# Patient Record
Sex: Female | Born: 1965 | Race: Black or African American | Hispanic: No | Marital: Single | Smoking: Former smoker
Health system: Southern US, Community
[De-identification: ages and names within clinical notes are randomized; demographics above are authoritative.]

## PROBLEM LIST (undated history)

## (undated) DIAGNOSIS — E119 Type 2 diabetes mellitus without complications: Secondary | ICD-10-CM

## (undated) DIAGNOSIS — N289 Disorder of kidney and ureter, unspecified: Secondary | ICD-10-CM

## (undated) DIAGNOSIS — M329 Systemic lupus erythematosus, unspecified: Secondary | ICD-10-CM

## (undated) DIAGNOSIS — G43909 Migraine, unspecified, not intractable, without status migrainosus: Secondary | ICD-10-CM

## (undated) DIAGNOSIS — J45909 Unspecified asthma, uncomplicated: Secondary | ICD-10-CM

## (undated) DIAGNOSIS — I10 Essential (primary) hypertension: Secondary | ICD-10-CM

## (undated) DIAGNOSIS — IMO0002 Reserved for concepts with insufficient information to code with codable children: Secondary | ICD-10-CM

## (undated) DIAGNOSIS — I7300 Raynaud's syndrome without gangrene: Secondary | ICD-10-CM

## (undated) DIAGNOSIS — G5600 Carpal tunnel syndrome, unspecified upper limb: Secondary | ICD-10-CM

## (undated) HISTORY — PX: OTHER SURGICAL HISTORY: SHX169

## (undated) HISTORY — PX: TUBAL LIGATION: SHX77

## (undated) HISTORY — PX: CHOLECYSTECTOMY: SHX55

---

## 2008-09-10 ENCOUNTER — Emergency Department (HOSPITAL_BASED_OUTPATIENT_CLINIC_OR_DEPARTMENT_OTHER): Admission: EM | Admit: 2008-09-10 | Discharge: 2008-09-10 | Payer: Self-pay | Admitting: Emergency Medicine

## 2010-05-07 LAB — COMPREHENSIVE METABOLIC PANEL
AST: 76 U/L — ABNORMAL HIGH (ref 0–37)
Albumin: 4.3 g/dL (ref 3.5–5.2)
Calcium: 9.3 mg/dL (ref 8.4–10.5)
Chloride: 103 mEq/L (ref 96–112)
Creatinine, Ser: 1.1 mg/dL (ref 0.4–1.2)
GFR calc Af Amer: >60 mL/min (ref >60)
Total Bilirubin: 0.6 mg/dL (ref 0.3–1.2)

## 2010-05-07 LAB — DIFFERENTIAL
Eosinophils Relative: 0 % (ref 0–5)
Lymphocytes Relative: 18 % (ref 12–46)
Lymphs Abs: 2.2 10*3/uL (ref 0.7–4.0)
Monocytes Absolute: 0.7 10*3/uL (ref 0.1–1.0)

## 2010-05-07 LAB — URINE MICROSCOPIC-ADD ON

## 2010-05-07 LAB — URINALYSIS, ROUTINE W REFLEX MICROSCOPIC
Glucose, UA: NEGATIVE mg/dL
Specific Gravity, Urine: 1.021 (ref 1.005–1.030)
pH: 6 (ref 5.0–8.0)

## 2010-05-07 LAB — CBC
MCV: 87.1 fL (ref 78.0–100.0)
Platelets: 273 10*3/uL (ref 150–400)
WBC: 11.9 10*3/uL — ABNORMAL HIGH (ref 4.0–10.5)

## 2010-06-07 ENCOUNTER — Emergency Department (HOSPITAL_BASED_OUTPATIENT_CLINIC_OR_DEPARTMENT_OTHER)
Admission: EM | Admit: 2010-06-07 | Discharge: 2010-06-08 | Disposition: A | Discharge status: Home / Self Care | Payer: Self-pay | Attending: Emergency Medicine | Admitting: Emergency Medicine

## 2010-06-07 DIAGNOSIS — J45909 Unspecified asthma, uncomplicated: Secondary | ICD-10-CM | POA: Insufficient documentation

## 2010-06-07 DIAGNOSIS — G43909 Migraine, unspecified, not intractable, without status migrainosus: Secondary | ICD-10-CM | POA: Insufficient documentation

## 2011-10-02 ENCOUNTER — Emergency Department (HOSPITAL_BASED_OUTPATIENT_CLINIC_OR_DEPARTMENT_OTHER)
Admission: EM | Admit: 2011-10-02 | Discharge: 2011-10-02 | Disposition: A | Discharge status: Home / Self Care | Payer: Self-pay | Attending: Emergency Medicine | Admitting: Emergency Medicine

## 2011-10-02 ENCOUNTER — Encounter (HOSPITAL_BASED_OUTPATIENT_CLINIC_OR_DEPARTMENT_OTHER): Payer: Self-pay | Admitting: *Deleted

## 2011-10-02 DIAGNOSIS — J45909 Unspecified asthma, uncomplicated: Secondary | ICD-10-CM | POA: Insufficient documentation

## 2011-10-02 DIAGNOSIS — Z9089 Acquired absence of other organs: Secondary | ICD-10-CM | POA: Insufficient documentation

## 2011-10-02 DIAGNOSIS — I10 Essential (primary) hypertension: Secondary | ICD-10-CM | POA: Insufficient documentation

## 2011-10-02 DIAGNOSIS — G43909 Migraine, unspecified, not intractable, without status migrainosus: Secondary | ICD-10-CM | POA: Insufficient documentation

## 2011-10-02 HISTORY — DX: Essential (primary) hypertension: I10

## 2011-10-02 HISTORY — DX: Migraine, unspecified, not intractable, without status migrainosus: G43.909

## 2011-10-02 HISTORY — DX: Unspecified asthma, uncomplicated: J45.909

## 2011-10-02 HISTORY — DX: Disorder of kidney and ureter, unspecified: N28.9

## 2011-10-02 LAB — BASIC METABOLIC PANEL
Calcium: 8.9 mg/dL (ref 8.4–10.5)
GFR calc non Af Amer: 74 mL/min — ABNORMAL LOW (ref >90)
Glucose, Bld: 106 mg/dL — ABNORMAL HIGH (ref 70–99)
Sodium: 140 mEq/L (ref 135–145)

## 2011-10-02 MED ORDER — HYDROMORPHONE HCL PF 1 MG/ML IJ SOLN
1.0000 mg | Freq: Once | INTRAMUSCULAR | Status: AC
  Administered 2011-10-02: 1 mg via INTRAVENOUS
  Filled 2011-10-02: qty 1

## 2011-10-02 MED ORDER — DIPHENHYDRAMINE HCL 50 MG/ML IJ SOLN
12.5000 mg | Freq: Once | INTRAMUSCULAR | Status: AC
  Administered 2011-10-02: 12.5 mg via INTRAVENOUS
  Filled 2011-10-02: qty 1

## 2011-10-02 MED ORDER — PROCHLORPERAZINE EDISYLATE 5 MG/ML IJ SOLN
10.0000 mg | Freq: Four times a day (QID) | INTRAMUSCULAR | Status: DC | PRN
Start: 2011-10-02 — End: 2011-10-02
  Filled 2011-10-02: qty 2

## 2011-10-02 MED ORDER — PROMETHAZINE HCL 25 MG PO TABS: 25.0000 mg | ORAL_TABLET | Freq: Four times a day (QID) | ORAL | Status: DC | PRN

## 2011-10-02 MED ORDER — SODIUM CHLORIDE 0.9 % IV SOLN
Freq: Once | INTRAVENOUS | Status: AC
  Administered 2011-10-02: 19:00:00 via INTRAVENOUS

## 2011-10-02 MED ORDER — PROCHLORPERAZINE MALEATE 10 MG PO TABS
10.0000 mg | ORAL_TABLET | Freq: Four times a day (QID) | ORAL | Status: DC | PRN
  Administered 2011-10-02: 10 mg via ORAL

## 2011-10-02 MED ORDER — KETOROLAC TROMETHAMINE 30 MG/ML IJ SOLN
15.0000 mg | Freq: Once | INTRAMUSCULAR | Status: AC
  Administered 2011-10-02: 15 mg via INTRAVENOUS
  Filled 2011-10-02: qty 1

## 2011-10-02 MED ORDER — HYDROCODONE-ACETAMINOPHEN 5-325 MG PO TABS: 1.0000 | ORAL_TABLET | ORAL | Status: AC | PRN

## 2011-10-02 MED ORDER — ALUM & MAG HYDROXIDE-SIMETH 200-200-20 MG/5ML PO SUSP
15.0000 mL | Freq: Once | ORAL | Status: AC
  Administered 2011-10-02: 15 mL via ORAL
  Filled 2011-10-02: qty 30

## 2011-10-02 MED ORDER — PROCHLORPERAZINE MALEATE 10 MG PO TABS
ORAL_TABLET | ORAL | Status: AC
  Filled 2011-10-02: qty 1

## 2011-10-02 NOTE — ED Notes (Signed)
Pt. Reports not taking her B?P med for a couple of mths now.  Pt. Reports no specific reason why.

## 2011-10-02 NOTE — ED Notes (Signed)
BP noted. Pt admits she stopped taking her BP medication a few months ago.

## 2011-10-02 NOTE — ED Notes (Signed)
Headache x 4 days

## 2011-10-02 NOTE — ED Notes (Signed)
Pt now c/o gastritis pain from meds she has been giving. RN made aware.

## 2011-10-02 NOTE — ED Provider Notes (Signed)
History     CSN: [id number]  Arrival date & time 10/02/11  1742   First MD Initiated Contact with Patient 10/02/11 1754      Chief Complaint  Patient presents with  . Migraine    (Consider location/radiation/quality/duration/timing/severity/associated sxs/prior treatment) HPIAnita Sharlet Bailey is a 46 y.o. female with a history of high blood pressure and migraine headaches. Patient has not been taking her hypertensive medication due to cost. On intake her blood pressures actually not that high with being 173/74. She's complaining about recurrent headaches today being a migraine headache that is typical for her migraine headaches, it came on gradually, is throbbing, located on the left side of her head, associated with photophobia, mild phonophobia, nausea, worse on movement.  No other alleviating or exacerbating factors and no other associated symptoms.  Past Medical History  Diagnosis Date  . Hypertension   . Migraine   . Renal disorder   . Asthma     Past Surgical History  Procedure Date  . Cholecystectomy   . C sections     No family history on file.  History  Substance Use Topics  . Smoking status: Former Games developer  . Smokeless tobacco: Not on file  . Alcohol Use: No    OB History    Grav Para Term Preterm Abortions TAB SAB Ect Mult Living                  Review of SystemsPositive for headache and nausea; Patient denies any fevers or chills, changes in vision, earache, sore throat, neck pain or stiffness, chest pain or pressure, palpitations, syncope, dyspnea, cough, wheezing,  abdominal pain, vomiting, diarrhea, melena, red bloody stools, frequency, dysuria, myalgias, arthralgias, back pain, recent trauma, rash, itching, skin lesions, easy bruising or bleeding, seizures, numbness, tingling or weakness.     Allergies  Iodine  Home Medications   Current Outpatient Rx  Name Route Sig Dispense Refill  . ACETAMINOPHEN 500 MG PO TABS Oral Take 1,000 mg by mouth every  6 (six) hours as needed. For migraine    . IBUPROFEN 800 MG PO TABS Oral Take 800 mg by mouth every 8 (eight) hours as needed. For migraine      BP 124/75  Pulse 85  Temp 98.4 F (36.9 C) (Oral)  Resp 20  SpO2 100%  Physical Exam VITAL SIGNS:   Filed Vitals:   10/02/11 2256  BP: 152/91  Pulse: 56  Temp:   Resp: 18   CONSTITUTIONAL: Awake, oriented, appears non-toxic HENT: Atraumatic, normocephalic, oral mucosa pink and moist, airway patent. Nares patent without drainage. External ears normal. EYES: Conjunctiva clear, EOMI, PERRLA NECK: Trachea midline, non-tender, supple CARDIOVASCULAR: Normal heart rate, Normal rhythm, No murmurs, rubs, gallops PULMONARY/CHEST: Clear to auscultation, no rhonchi, wheezes, or rales. Symmetrical breath sounds. Non-tender. ABDOMINAL: Non-distended, soft, non-tender - no rebound or guarding.  BS normal. NEUROLOGIC: Non-focal, moving all four extremities, no gross sensory or motor deficits.  EXTREMITIES: No clubbing, cyanosis, or edema SKIN: Warm, Dry, No erythema, No rash  ED Course  Procedures (including critical care time)  Labs Reviewed  BASIC METABOLIC PANEL - Abnormal; Notable for the following:    Potassium 3.3 (*)     Glucose, Bld 106 (*)     GFR calc non Af Amer 74 (*)     GFR calc Af Amer 86 (*)     All other components within normal limits   No results found.   1. Migraine  MDM  Emma Bailey is a 46 y.o. female presenting with a migraine headache typical of her migraine headaches in the past. Patient has been out of her hypertensive medications recently, due to this we'll check a BMP to check her creatinine she does have renal issues.  I do not think this headache is related to her blood pressure.  She's having no chest pain and no shortness of breath suggestive of ACS or pulmonary edema respectively.   Treat patient for migraine headache with fluids, Toradol and Compazine, and Benadryl. Patient did give good relief  with the first set of medications, added a one-time dose of Dilaudid which made her feel much better. She also requested some Maalox for some stomach upset and she would like to go home. Patient's symptoms are completely resolved and consistent with prior migraine headaches, she has no endorgan damage suggestive of hypertensive emergency, her presentation is not suggestive of subarachnoid hemorrhage or central nervous system infection. Patient discharged home in good condition.  She is urged to followup with her primary care physician or nephrologist to further manage her blood pressure. Patient is able to eat, will not supplement potassium at this time, she can eat a meal.         Jones Skene, MD 10/03/11 0149

## 2012-05-31 ENCOUNTER — Emergency Department (HOSPITAL_BASED_OUTPATIENT_CLINIC_OR_DEPARTMENT_OTHER)
Admission: EM | Admit: 2012-05-31 | Discharge: 2012-05-31 | Disposition: A | Discharge status: Home / Self Care | Payer: Self-pay | Attending: Emergency Medicine | Admitting: Emergency Medicine

## 2012-05-31 ENCOUNTER — Encounter (HOSPITAL_BASED_OUTPATIENT_CLINIC_OR_DEPARTMENT_OTHER): Payer: Self-pay | Admitting: *Deleted

## 2012-05-31 DIAGNOSIS — IMO0002 Reserved for concepts with insufficient information to code with codable children: Secondary | ICD-10-CM

## 2012-05-31 DIAGNOSIS — Y9389 Activity, other specified: Secondary | ICD-10-CM | POA: Insufficient documentation

## 2012-05-31 DIAGNOSIS — Z79899 Other long term (current) drug therapy: Secondary | ICD-10-CM | POA: Insufficient documentation

## 2012-05-31 DIAGNOSIS — Z87891 Personal history of nicotine dependence: Secondary | ICD-10-CM | POA: Insufficient documentation

## 2012-05-31 DIAGNOSIS — Y9289 Other specified places as the place of occurrence of the external cause: Secondary | ICD-10-CM | POA: Insufficient documentation

## 2012-05-31 DIAGNOSIS — E119 Type 2 diabetes mellitus without complications: Secondary | ICD-10-CM | POA: Insufficient documentation

## 2012-05-31 DIAGNOSIS — S61409A Unspecified open wound of unspecified hand, initial encounter: Secondary | ICD-10-CM | POA: Insufficient documentation

## 2012-05-31 DIAGNOSIS — W268XXA Contact with other sharp object(s), not elsewhere classified, initial encounter: Secondary | ICD-10-CM | POA: Insufficient documentation

## 2012-05-31 DIAGNOSIS — G43909 Migraine, unspecified, not intractable, without status migrainosus: Secondary | ICD-10-CM | POA: Insufficient documentation

## 2012-05-31 DIAGNOSIS — Z23 Encounter for immunization: Secondary | ICD-10-CM | POA: Insufficient documentation

## 2012-05-31 DIAGNOSIS — I10 Essential (primary) hypertension: Secondary | ICD-10-CM | POA: Insufficient documentation

## 2012-05-31 DIAGNOSIS — J45909 Unspecified asthma, uncomplicated: Secondary | ICD-10-CM | POA: Insufficient documentation

## 2012-05-31 DIAGNOSIS — Z87448 Personal history of other diseases of urinary system: Secondary | ICD-10-CM | POA: Insufficient documentation

## 2012-05-31 HISTORY — DX: Type 2 diabetes mellitus without complications: E11.9

## 2012-05-31 MED ORDER — TETANUS-DIPHTH-ACELL PERTUSSIS 5-2.5-18.5 LF-MCG/0.5 IM SUSP
0.5000 mL | Freq: Once | INTRAMUSCULAR | Status: AC
  Administered 2012-05-31: 0.5 mL via INTRAMUSCULAR
  Filled 2012-05-31: qty 0.5

## 2012-05-31 NOTE — ED Notes (Signed)
Lac to left hand. Cut with razor while trying to open toy.Bleeding controlled.

## 2012-05-31 NOTE — ED Provider Notes (Signed)
History     CSN: [id number]  Arrival date & time 05/31/12  4098   First MD Initiated Contact with Patient 05/31/12 1938      Chief Complaint  Patient presents with  . Laceration    (Consider location/radiation/quality/duration/timing/severity/associated sxs/prior treatment) HPI Comments: Patient presents emergency department with chief complaint of left hand laceration. She states that she was trying to (4 with a box cutter, when she cuts the posterior aspect of her left hand. She states that it is moderately painful. Her last tetanus shot is unknown. Bleeding is controlled at this time. She denies any radiating symptoms. She has not taken anything to alleviate her symptoms.  The history is provided by the patient. No language interpreter was used.    Past Medical History  Diagnosis Date  . Hypertension   . Migraine   . Renal disorder   . Asthma   . Diabetes mellitus without complication     Past Surgical History  Procedure Laterality Date  . Cholecystectomy    . C sections      History reviewed. No pertinent family history.  History  Substance Use Topics  . Smoking status: Former Games developer  . Smokeless tobacco: Not on file  . Alcohol Use: No    OB History   Grav Para Term Preterm Abortions TAB SAB Ect Mult Living                  Review of Systems  All other systems reviewed and are negative.    Allergies  Iodine  Home Medications   Current Outpatient Rx  Name  Route  Sig  Dispense  Refill  . candesartan (ATACAND) 16 MG tablet   Oral   Take 16 mg by mouth daily.         . cholecalciferol (VITAMIN D) 1000 UNITS tablet   Oral   Take 1,000 Units by mouth daily.         Marland Kitchen spironolactone-hydrochlorothiazide (ALDACTAZIDE) 25-25 MG per tablet   Oral   Take 0.5 tablets by mouth daily.         Marland Kitchen topiramate (TOPAMAX) 25 MG capsule   Oral   Take 25 mg by mouth 2 (two) times daily.         Marland Kitchen acetaminophen (TYLENOL) 500 MG tablet   Oral   Take  1,000 mg by mouth every 6 (six) hours as needed. For migraine         . ibuprofen (ADVIL,MOTRIN) 800 MG tablet   Oral   Take 800 mg by mouth every 8 (eight) hours as needed. For migraine         . EXPIRED: promethazine (PHENERGAN) 25 MG tablet   Oral   Take 1 tablet (25 mg total) by mouth every 6 (six) hours as needed for nausea.   30 tablet   0     BP 122/60  Pulse 50  Temp(Src) 98.4 F (36.9 C) (Oral)  Resp 20  Ht 5\' 7"  (1.702 m)  Wt 195 lb (88.451 kg)  BMI 30.53 kg/m2  SpO2 98%  LMP 05/24/2012  Physical Exam  Nursing note and vitals reviewed. Constitutional: She is oriented to person, place, and time. She appears well-developed and well-nourished.  HENT:  Head: Normocephalic and atraumatic.  Eyes: Conjunctivae and EOM are normal.  Neck: Normal range of motion.  Cardiovascular: Normal rate.   Pulmonary/Chest: Effort normal.  Abdominal: She exhibits no distension.  Musculoskeletal: Normal range of motion.  Neurological: She is alert and  oriented to person, place, and time.  Skin: Skin is dry.     1 cm laceration to posterior aspect of left hand as diagrammed  Psychiatric: She has a normal mood and affect. Her behavior is normal. Judgment and thought content normal.    ED Course  Procedures (including critical care time)  Labs Reviewed - No data to display No results found. LACERATION REPAIR Performed by: Roxy Horseman Authorized by: Roxy Horseman Consent: Verbal consent obtained. Risks and benefits: risks, benefits and alternatives were discussed Consent given by: patient Patient identity confirmed: provided demographic data Prepped and Draped in normal sterile fashion Wound explored  Laceration Location: Posterior aspect of left hand  Laceration Length: 1cm  No Foreign Bodies seen or palpated  Anesthesia: local infiltration  Local anesthetic: lidocaine 2 % without epinephrine  Anesthetic total: 2 ml  Irrigation method: syringe Amount  of cleaning: standard  Skin closure: 4-0 Prolene   Number of sutures: 2   Technique: Simple   Patient tolerance: Patient tolerated the procedure well with no immediate complications.   1. Laceration       MDM  Patient with uncomplicated skin laceration of posterior left hand. Tetanus shot updated. Laceration repaired without complication. Patient is stable and ready for discharge.        Roxy Horseman, PA-C 05/31/12 2032

## 2012-05-31 NOTE — ED Provider Notes (Signed)
Medical screening examination/treatment/procedure(s) were performed by non-physician practitioner and as supervising physician I was immediately available for consultation/collaboration.   Lanika Colgate, MD 05/31/12 2345 

## 2012-06-11 ENCOUNTER — Emergency Department (HOSPITAL_BASED_OUTPATIENT_CLINIC_OR_DEPARTMENT_OTHER)
Admission: EM | Admit: 2012-06-11 | Discharge: 2012-06-11 | Disposition: A | Discharge status: Home / Self Care | Payer: Self-pay | Attending: Emergency Medicine | Admitting: Emergency Medicine

## 2012-06-11 ENCOUNTER — Encounter (HOSPITAL_BASED_OUTPATIENT_CLINIC_OR_DEPARTMENT_OTHER): Payer: Self-pay | Admitting: Student

## 2012-06-11 DIAGNOSIS — Z79899 Other long term (current) drug therapy: Secondary | ICD-10-CM | POA: Insufficient documentation

## 2012-06-11 DIAGNOSIS — Z4802 Encounter for removal of sutures: Secondary | ICD-10-CM | POA: Insufficient documentation

## 2012-06-11 DIAGNOSIS — G43909 Migraine, unspecified, not intractable, without status migrainosus: Secondary | ICD-10-CM | POA: Insufficient documentation

## 2012-06-11 DIAGNOSIS — I10 Essential (primary) hypertension: Secondary | ICD-10-CM | POA: Insufficient documentation

## 2012-06-11 DIAGNOSIS — E119 Type 2 diabetes mellitus without complications: Secondary | ICD-10-CM | POA: Insufficient documentation

## 2012-06-11 DIAGNOSIS — Z87891 Personal history of nicotine dependence: Secondary | ICD-10-CM | POA: Insufficient documentation

## 2012-06-11 DIAGNOSIS — J45909 Unspecified asthma, uncomplicated: Secondary | ICD-10-CM | POA: Insufficient documentation

## 2012-06-11 DIAGNOSIS — Z87448 Personal history of other diseases of urinary system: Secondary | ICD-10-CM | POA: Insufficient documentation

## 2012-06-11 NOTE — ED Provider Notes (Signed)
History     CSN: [id number]  Arrival date & time 06/11/12  7829   First MD Initiated Contact with Patient 06/11/12 [phone]      Chief Complaint  Patient presents with  . Suture / Staple Removal    (Consider location/radiation/quality/duration/timing/severity/associated sxs/prior treatment) Patient is a 47 y.o. female presenting with suture removal. The history is provided by the patient.  Suture / Staple Removal  The sutures were placed 11 to 14 days ago. There has been no treatment since the wound repair. There is no redness present. There is no swelling present. The pain has improved. She has no difficulty moving the affected extremity or digit.    Past Medical History  Diagnosis Date  . Hypertension   . Migraine   . Renal disorder   . Asthma   . Diabetes mellitus without complication     Past Surgical History  Procedure Laterality Date  . Cholecystectomy    . C sections      History reviewed. No pertinent family history.  History  Substance Use Topics  . Smoking status: Former Games developer  . Smokeless tobacco: Not on file  . Alcohol Use: No    OB History   Grav Para Term Preterm Abortions TAB SAB Ect Mult Living                  Review of Systems  All other systems reviewed and are negative.    Allergies  Iodine  Home Medications   Current Outpatient Rx  Name  Route  Sig  Dispense  Refill  . acetaminophen (TYLENOL) 500 MG tablet   Oral   Take 1,000 mg by mouth every 6 (six) hours as needed. For migraine         . candesartan (ATACAND) 16 MG tablet   Oral   Take 16 mg by mouth daily.         . cholecalciferol (VITAMIN D) 1000 UNITS tablet   Oral   Take 1,000 Units by mouth daily.         Marland Kitchen ibuprofen (ADVIL,MOTRIN) 800 MG tablet   Oral   Take 800 mg by mouth every 8 (eight) hours as needed. For migraine         . EXPIRED: promethazine (PHENERGAN) 25 MG tablet   Oral   Take 1 tablet (25 mg total) by mouth every 6 (six) hours as needed  for nausea.   30 tablet   0   . spironolactone-hydrochlorothiazide (ALDACTAZIDE) 25-25 MG per tablet   Oral   Take 0.5 tablets by mouth every other day.          . topiramate (TOPAMAX) 25 MG capsule   Oral   Take 25 mg by mouth 2 (two) times daily.           LMP 05/24/2012  Physical Exam  Nursing note reviewed. Constitutional: She appears well-developed and well-nourished.  Musculoskeletal: Normal range of motion.  Well healing wound left hand.   Neurological: She is alert.  Skin: Skin is warm and dry.  Psychiatric: She has a normal mood and affect.    ED Course  Procedures (including critical care time)  Labs Reviewed - No data to display No results found.   No diagnosis found.    MDM  Sutures removed by tech.  Patient advised re s/s of infection and need for recheck if these occure.      Hilario Quarry, MD 06/11/12 [phone]

## 2012-06-11 NOTE — ED Notes (Signed)
Suture removal on left hand 

## 2012-08-04 ENCOUNTER — Encounter (HOSPITAL_BASED_OUTPATIENT_CLINIC_OR_DEPARTMENT_OTHER): Payer: Self-pay | Admitting: Emergency Medicine

## 2012-08-04 ENCOUNTER — Emergency Department (HOSPITAL_BASED_OUTPATIENT_CLINIC_OR_DEPARTMENT_OTHER)
Admission: EM | Admit: 2012-08-04 | Discharge: 2012-08-04 | Disposition: A | Discharge status: Home / Self Care | Payer: Medicaid Other | Attending: Emergency Medicine | Admitting: Emergency Medicine

## 2012-08-04 DIAGNOSIS — E119 Type 2 diabetes mellitus without complications: Secondary | ICD-10-CM | POA: Insufficient documentation

## 2012-08-04 DIAGNOSIS — I10 Essential (primary) hypertension: Secondary | ICD-10-CM | POA: Insufficient documentation

## 2012-08-04 DIAGNOSIS — K2990 Gastroduodenitis, unspecified, without bleeding: Secondary | ICD-10-CM | POA: Insufficient documentation

## 2012-08-04 DIAGNOSIS — K2970 Gastritis, unspecified, without bleeding: Secondary | ICD-10-CM | POA: Insufficient documentation

## 2012-08-04 DIAGNOSIS — Z87448 Personal history of other diseases of urinary system: Secondary | ICD-10-CM | POA: Insufficient documentation

## 2012-08-04 DIAGNOSIS — Z9889 Other specified postprocedural states: Secondary | ICD-10-CM | POA: Insufficient documentation

## 2012-08-04 DIAGNOSIS — Z87891 Personal history of nicotine dependence: Secondary | ICD-10-CM | POA: Insufficient documentation

## 2012-08-04 DIAGNOSIS — J45909 Unspecified asthma, uncomplicated: Secondary | ICD-10-CM | POA: Insufficient documentation

## 2012-08-04 DIAGNOSIS — Z79899 Other long term (current) drug therapy: Secondary | ICD-10-CM | POA: Insufficient documentation

## 2012-08-04 DIAGNOSIS — Z3202 Encounter for pregnancy test, result negative: Secondary | ICD-10-CM | POA: Insufficient documentation

## 2012-08-04 DIAGNOSIS — Z9851 Tubal ligation status: Secondary | ICD-10-CM | POA: Insufficient documentation

## 2012-08-04 DIAGNOSIS — G43909 Migraine, unspecified, not intractable, without status migrainosus: Secondary | ICD-10-CM | POA: Insufficient documentation

## 2012-08-04 DIAGNOSIS — Z9089 Acquired absence of other organs: Secondary | ICD-10-CM | POA: Insufficient documentation

## 2012-08-04 LAB — COMPREHENSIVE METABOLIC PANEL
Alkaline Phosphatase: 85 U/L (ref 39–117)
BUN: 16 mg/dL (ref 6–23)
Chloride: 103 mEq/L (ref 96–112)
GFR calc Af Amer: 60 mL/min — ABNORMAL LOW (ref >90)
GFR calc non Af Amer: 52 mL/min — ABNORMAL LOW (ref >90)
Glucose, Bld: 116 mg/dL — ABNORMAL HIGH (ref 70–99)
Potassium: 3.3 mEq/L — ABNORMAL LOW (ref 3.5–5.1)
Total Bilirubin: 0.5 mg/dL (ref 0.3–1.2)

## 2012-08-04 LAB — URINALYSIS, ROUTINE W REFLEX MICROSCOPIC
Hgb urine dipstick: NEGATIVE
Ketones, ur: NEGATIVE mg/dL
Leukocytes, UA: NEGATIVE
Protein, ur: NEGATIVE mg/dL
Urobilinogen, UA: 1 mg/dL (ref 0.0–1.0)

## 2012-08-04 LAB — CBC WITH DIFFERENTIAL/PLATELET
HCT: 36.8 % (ref 36.0–46.0)
Hemoglobin: 12.9 g/dL (ref 12.0–15.0)
Lymphs Abs: 3.1 10*3/uL (ref 0.7–4.0)
MCH: 29.3 pg (ref 26.0–34.0)
Monocytes Absolute: 0.6 10*3/uL (ref 0.1–1.0)
Monocytes Relative: 9 % (ref 3–12)
Neutro Abs: 3.4 10*3/uL (ref 1.7–7.7)
Neutrophils Relative %: 45 % (ref 43–77)
RBC: 4.4 MIL/uL (ref 3.87–5.11)

## 2012-08-04 LAB — LIPASE, BLOOD: Lipase: 55 U/L (ref 11–59)

## 2012-08-04 MED ORDER — HYDROMORPHONE HCL PF 1 MG/ML IJ SOLN
1.0000 mg | Freq: Once | INTRAMUSCULAR | Status: AC
  Administered 2012-08-04: 1 mg via INTRAVENOUS
  Filled 2012-08-04: qty 1

## 2012-08-04 MED ORDER — ONDANSETRON HCL 4 MG/2ML IJ SOLN
4.0000 mg | Freq: Once | INTRAMUSCULAR | Status: AC
  Administered 2012-08-04: 4 mg via INTRAVENOUS
  Filled 2012-08-04: qty 2

## 2012-08-04 MED ORDER — SODIUM CHLORIDE 0.9 % IV SOLN
INTRAVENOUS | Status: DC
  Administered 2012-08-04: 06:00:00 via INTRAVENOUS

## 2012-08-04 MED ORDER — PANTOPRAZOLE SODIUM 40 MG IV SOLR
40.0000 mg | Freq: Once | INTRAVENOUS | Status: AC
  Administered 2012-08-04: 40 mg via INTRAVENOUS
  Filled 2012-08-04: qty 40

## 2012-08-04 MED ORDER — OXYCODONE-ACETAMINOPHEN 5-325 MG PO TABS: 1.0000 | ORAL_TABLET | Freq: Four times a day (QID) | ORAL | Status: DC | PRN

## 2012-08-04 MED ORDER — SODIUM CHLORIDE 0.9 % IV BOLUS (SEPSIS)
1000.0000 mL | Freq: Once | INTRAVENOUS | Status: AC
  Administered 2012-08-04: 1000 mL via INTRAVENOUS

## 2012-08-04 MED ORDER — PANTOPRAZOLE SODIUM 40 MG PO TBEC: 40.0000 mg | DELAYED_RELEASE_TABLET | Freq: Every day | ORAL | Status: DC

## 2012-08-04 MED ORDER — PROMETHAZINE HCL 25 MG PO TABS: 25.0000 mg | ORAL_TABLET | Freq: Four times a day (QID) | ORAL | Status: DC | PRN

## 2012-08-04 NOTE — ED Notes (Signed)
Pt called for family member to transport her home family member currently enroute.

## 2012-08-04 NOTE — Discharge Instructions (Signed)

## 2012-08-04 NOTE — ED Provider Notes (Signed)
History    CSN: [id number] Arrival date & time 08/04/12  0418  First MD Initiated Contact with Patient 08/04/12 [phone]     Chief Complaint  Patient presents with  . Abdominal Pain   (Consider location/radiation/quality/duration/timing/severity/associated sxs/prior Treatment) Patient is a 47 y.o. female presenting with abdominal pain.  Abdominal Pain Associated symptoms include abdominal pain.   Pt with history of remote cholecystectomy and occasional gastritis reports she woke up with moderate epigastric pain a short time ago, not improved with alka-seltzer or hot tea. She states pain has become worse and more diffuse and now associated with multiple episodes of non-bloody emesis. No fever, no diarrhea or dysuria.   Past Medical History  Diagnosis Date  . Hypertension   . Migraine   . Renal disorder   . Asthma   . Diabetes mellitus without complication    Past Surgical History  Procedure Laterality Date  . Cholecystectomy    . C sections    . Tubal ligation     No family history on file. History  Substance Use Topics  . Smoking status: Former Games developer  . Smokeless tobacco: Not on file  . Alcohol Use: No   OB History   Grav Para Term Preterm Abortions TAB SAB Ect Mult Living                 Review of Systems  Gastrointestinal: Positive for abdominal pain.   All other systems reviewed and are negative except as noted in HPI.    Allergies  Iodine  Home Medications   Current Outpatient Rx  Name  Route  Sig  Dispense  Refill  . acetaminophen (TYLENOL) 500 MG tablet   Oral   Take 1,000 mg by mouth every 6 (six) hours as needed. For migraine         . candesartan (ATACAND) 16 MG tablet   Oral   Take 16 mg by mouth daily.         . cholecalciferol (VITAMIN D) 1000 UNITS tablet   Oral   Take 1,000 Units by mouth daily.         Marland Kitchen ibuprofen (ADVIL,MOTRIN) 800 MG tablet   Oral   Take 800 mg by mouth every 8 (eight) hours as needed. For migraine          . EXPIRED: promethazine (PHENERGAN) 25 MG tablet   Oral   Take 1 tablet (25 mg total) by mouth every 6 (six) hours as needed for nausea.   30 tablet   0   . spironolactone-hydrochlorothiazide (ALDACTAZIDE) 25-25 MG per tablet   Oral   Take 0.5 tablets by mouth every other day.          . topiramate (TOPAMAX) 25 MG capsule   Oral   Take 25 mg by mouth 2 (two) times daily.          BP 137/80  Pulse 68  Temp(Src) 98.4 F (36.9 C) (Oral)  Ht 5\' 7"  (1.702 m)  Wt 188 lb (85.276 kg)  BMI 29.44 kg/m2  SpO2 100% Physical Exam  Nursing note and vitals reviewed. Constitutional: She is oriented to person, place, and time. She appears well-developed and well-nourished.  HENT:  Head: Normocephalic and atraumatic.  Eyes: EOM are normal. Pupils are equal, round, and reactive to light.  Neck: Normal range of motion. Neck supple.  Cardiovascular: Normal rate, normal heart sounds and intact distal pulses.   Pulmonary/Chest: Effort normal and breath sounds normal.  Abdominal: Bowel sounds are  normal. She exhibits no distension. There is tenderness (moderate epigastric). There is no rebound and no guarding.  Musculoskeletal: Normal range of motion. She exhibits no edema and no tenderness.  Neurological: She is alert and oriented to person, place, and time. She has normal strength. No cranial nerve deficit or sensory deficit.  Skin: Skin is warm and dry. No rash noted.  Psychiatric: She has a normal mood and affect.    ED Course  Procedures (including critical care time) Labs Reviewed  COMPREHENSIVE METABOLIC PANEL - Abnormal; Notable for the following:    Potassium 3.3 (*)    Glucose, Bld 116 (*)    Creatinine, Ser 1.20 (*)    AST 73 (*)    ALT 37 (*)    GFR calc non Af Amer 52 (*)    GFR calc Af Amer 60 (*)    All other components within normal limits  URINALYSIS, ROUTINE W REFLEX MICROSCOPIC  CBC WITH DIFFERENTIAL  LIPASE, BLOOD  PREGNANCY, URINE   No results found. 1.  Gastritis     MDM  Check labs, give IVF, pain and nausea meds.   Labs unremarkable, about at baseline from previous visits. Pain improved with IVF and meds. Will give a PO trial and anticipate discharge with oral meds at home.   Kayce Betty B. Bernette Mayers, MD 08/04/12 [phone]

## 2012-08-04 NOTE — ED Notes (Signed)
Two episodes of emesis since arrival currently denies pain or nausea no further emesis noted at this time.

## 2012-08-04 NOTE — ED Notes (Addendum)
Pt reports sudden onset abd pain after eating late meal denies vomiting but admits to nausea

## 2012-08-05 ENCOUNTER — Emergency Department (HOSPITAL_BASED_OUTPATIENT_CLINIC_OR_DEPARTMENT_OTHER): Payer: Medicaid Other

## 2012-08-05 ENCOUNTER — Emergency Department (HOSPITAL_BASED_OUTPATIENT_CLINIC_OR_DEPARTMENT_OTHER)
Admission: EM | Admit: 2012-08-05 | Discharge: 2012-08-05 | Disposition: A | Discharge status: Other Acute Inpatient Hospital | Payer: Medicaid Other | Attending: Emergency Medicine | Admitting: Emergency Medicine

## 2012-08-05 ENCOUNTER — Encounter (HOSPITAL_BASED_OUTPATIENT_CLINIC_OR_DEPARTMENT_OTHER): Payer: Self-pay | Admitting: *Deleted

## 2012-08-05 DIAGNOSIS — Z9851 Tubal ligation status: Secondary | ICD-10-CM | POA: Insufficient documentation

## 2012-08-05 DIAGNOSIS — E119 Type 2 diabetes mellitus without complications: Secondary | ICD-10-CM | POA: Diagnosis not present

## 2012-08-05 DIAGNOSIS — G43909 Migraine, unspecified, not intractable, without status migrainosus: Secondary | ICD-10-CM | POA: Insufficient documentation

## 2012-08-05 DIAGNOSIS — I10 Essential (primary) hypertension: Secondary | ICD-10-CM | POA: Insufficient documentation

## 2012-08-05 DIAGNOSIS — R109 Unspecified abdominal pain: Secondary | ICD-10-CM | POA: Diagnosis present

## 2012-08-05 DIAGNOSIS — J45909 Unspecified asthma, uncomplicated: Secondary | ICD-10-CM | POA: Insufficient documentation

## 2012-08-05 DIAGNOSIS — Z87448 Personal history of other diseases of urinary system: Secondary | ICD-10-CM | POA: Diagnosis not present

## 2012-08-05 DIAGNOSIS — M549 Dorsalgia, unspecified: Secondary | ICD-10-CM | POA: Diagnosis not present

## 2012-08-05 DIAGNOSIS — Z87891 Personal history of nicotine dependence: Secondary | ICD-10-CM | POA: Insufficient documentation

## 2012-08-05 DIAGNOSIS — Z79899 Other long term (current) drug therapy: Secondary | ICD-10-CM | POA: Diagnosis not present

## 2012-08-05 DIAGNOSIS — Z9089 Acquired absence of other organs: Secondary | ICD-10-CM | POA: Diagnosis not present

## 2012-08-05 DIAGNOSIS — R748 Abnormal levels of other serum enzymes: Secondary | ICD-10-CM | POA: Diagnosis not present

## 2012-08-05 DIAGNOSIS — K831 Obstruction of bile duct: Secondary | ICD-10-CM | POA: Diagnosis not present

## 2012-08-05 LAB — URINALYSIS, ROUTINE W REFLEX MICROSCOPIC
Glucose, UA: NEGATIVE mg/dL
Leukocytes, UA: NEGATIVE
Protein, ur: 30 mg/dL — AB
Specific Gravity, Urine: 1.012 (ref 1.005–1.030)
Urobilinogen, UA: 2 mg/dL — ABNORMAL HIGH (ref 0.0–1.0)

## 2012-08-05 LAB — COMPREHENSIVE METABOLIC PANEL
Albumin: 3.9 g/dL (ref 3.5–5.2)
BUN: 9 mg/dL (ref 6–23)
Calcium: 9.7 mg/dL (ref 8.4–10.5)
Creatinine, Ser: 1.2 mg/dL — ABNORMAL HIGH (ref 0.50–1.10)
Total Bilirubin: 5 mg/dL — ABNORMAL HIGH (ref 0.3–1.2)
Total Protein: 7.7 g/dL (ref 6.0–8.3)

## 2012-08-05 LAB — CBC
HCT: 35.2 % — ABNORMAL LOW (ref 36.0–46.0)
MCH: 29.6 pg (ref 26.0–34.0)
MCHC: 35.2 g/dL (ref 30.0–36.0)
MCV: 84 fL (ref 78.0–100.0)
RDW: 13.6 % (ref 11.5–15.5)

## 2012-08-05 LAB — URINE MICROSCOPIC-ADD ON

## 2012-08-05 LAB — LIPASE, BLOOD: Lipase: 34 U/L (ref 11–59)

## 2012-08-05 MED ORDER — HYDROMORPHONE HCL PF 1 MG/ML IJ SOLN
1.0000 mg | Freq: Once | INTRAMUSCULAR | Status: AC
  Administered 2012-08-05: 1 mg via INTRAVENOUS
  Filled 2012-08-05: qty 1

## 2012-08-05 MED ORDER — SODIUM CHLORIDE 0.9 % IV BOLUS (SEPSIS)
1000.0000 mL | Freq: Once | INTRAVENOUS | Status: AC
  Administered 2012-08-05: 1000 mL via INTRAVENOUS

## 2012-08-05 MED ORDER — ONDANSETRON HCL 4 MG/2ML IJ SOLN
4.0000 mg | Freq: Once | INTRAMUSCULAR | Status: AC
  Administered 2012-08-05: 4 mg via INTRAVENOUS
  Filled 2012-08-05: qty 2

## 2012-08-05 NOTE — ED Notes (Addendum)
Back pain. She was here for gastritis last week and pain in her back started afterward. Drove herself here.

## 2012-08-05 NOTE — ED Provider Notes (Signed)
History    CSN: [id number] Arrival date & time 08/05/12  1645  First MD Initiated Contact with Patient 08/05/12 1659     Chief Complaint  Patient presents with  . Back Pain   (Consider location/radiation/quality/duration/timing/severity/associated sxs/prior Treatment) HPI Pt presenting with c/o abdominal pain and left flank pain.  Pt states her pain began today- was discharged earlier this morning from ED for gastritis.  She states pain is constant- worse in her left upper abdomen and also in left flank.  No fever, no dysuria.  No changes in stool.  She tried taking oxycodone for her symptoms but this did not help.  She has not had much to eat or drink today.  She states she had cholecystectomy approx 30 years ago.  There are no other associated systemic symptoms, there are no other alleviating or modifying factors.  Past Medical History  Diagnosis Date  . Hypertension   . Migraine   . Renal disorder   . Asthma   . Diabetes mellitus without complication    Past Surgical History  Procedure Laterality Date  . Cholecystectomy    . C sections    . Tubal ligation     No family history on file. History  Substance Use Topics  . Smoking status: Former Games developer  . Smokeless tobacco: Not on file  . Alcohol Use: No   OB History   Grav Para Term Preterm Abortions TAB SAB Ect Mult Living                 Review of Systems ROS reviewed and all otherwise negative except for mentioned in HPI  Allergies  Iodine  Home Medications   Current Outpatient Rx  Name  Route  Sig  Dispense  Refill  . acetaminophen (TYLENOL) 500 MG tablet   Oral   Take 1,000 mg by mouth every 6 (six) hours as needed. For migraine         . candesartan (ATACAND) 16 MG tablet   Oral   Take 16 mg by mouth daily.         . cholecalciferol (VITAMIN D) 1000 UNITS tablet   Oral   Take 1,000 Units by mouth daily.         Marland Kitchen oxyCODONE-acetaminophen (PERCOCET/ROXICET) 5-325 MG per tablet   Oral   Take  1-2 tablets by mouth every 6 (six) hours as needed for pain.   20 tablet   0   . pantoprazole (PROTONIX) 40 MG tablet   Oral   Take 1 tablet (40 mg total) by mouth daily.   30 tablet   0   . promethazine (PHENERGAN) 25 MG tablet   Oral   Take 1 tablet (25 mg total) by mouth every 6 (six) hours as needed for nausea.   30 tablet   0   . spironolactone-hydrochlorothiazide (ALDACTAZIDE) 25-25 MG per tablet   Oral   Take 0.5 tablets by mouth every other day.          . topiramate (TOPAMAX) 25 MG capsule   Oral   Take 25 mg by mouth 2 (two) times daily.          BP 150/88  Pulse 80  Temp(Src) 99 F (37.2 C) (Oral)  Resp 22  Wt 198 lb (89.812 kg)  BMI 31 kg/m2  SpO2 98% Vitals reviewed Physical Exam Physical Examination: General appearance - alert, well appearing, and in no distress Mental status - alert, oriented to person, place, and time Eyes -  no conjunctival injection, no scleral icterus Mouth - mucous membranes moist, pharynx normal without lesions Chest - clear to auscultation, no wheezes, rales or rhonchi, symmetric air entry Heart - normal rate, regular rhythm, normal S1, S2, no murmurs, rubs, clicks or gallops Abdomen - soft, ttp left upper abdomen, no gaurding or rebound tenderness, nondistended, no masses or organomegaly, nabs Back exam - full range of motion, no left CVA tenderness Extremities - peripheral pulses normal, no pedal edema, no clubbing or cyanosis Skin - normal coloration and turgor, no rashes  ED Course  Procedures (including critical care time)  7:45 PM discussed results with patient, her doctor is at [facility] and she would prefer to be transferred there.  I have d/w Dr. Eden Emms, hospitalist at [facility]- he accepts patient for transfer.  Labs Reviewed  URINALYSIS, ROUTINE W REFLEX MICROSCOPIC - Abnormal; Notable for the following:    Color, Urine AMBER (*)    Hgb urine dipstick TRACE (*)    Bilirubin Urine LARGE (*)    Ketones, ur 15  (*)    Protein, ur 30 (*)    Urobilinogen, UA 2.0 (*)    All other components within normal limits  URINE MICROSCOPIC-ADD ON - Abnormal; Notable for the following:    Bacteria, UA FEW (*)    All other components within normal limits  CBC - Abnormal; Notable for the following:    HCT 35.2 (*)    All other components within normal limits  COMPREHENSIVE METABOLIC PANEL - Abnormal; Notable for the following:    Potassium 3.3 (*)    Glucose, Bld 101 (*)    Creatinine, Ser 1.20 (*)    AST 623 (*)    ALT 841 (*)    Alkaline Phosphatase 262 (*)    Total Bilirubin 5.0 (*)    GFR calc non Af Amer 52 (*)    GFR calc Af Amer 60 (*)    All other components within normal limits  LIPASE, BLOOD   Ct Abdomen Pelvis Wo Contrast  08/05/2012   *RADIOLOGY REPORT*  Clinical Data: Left flank pain.  CT ABDOMEN AND PELVIS WITHOUT CONTRAST  Technique:  Multidetector CT imaging of the abdomen and pelvis was performed following the standard protocol without intravenous contrast.  Comparison: None.  Findings: Sagittal images of the spine are unremarkable.  Lung bases shows a linear scarring left base anteriorly laterally.  Unenhanced liver, pancreas, spleen and adrenals are unremarkable. The patient is status post cholecystectomy.  CBD measures 9 mm in diameter probable post cholecystectomy.  No aortic aneurysm.  Mild atherosclerotic calcifications of the abdominal aorta and the iliac arteries.  Unenhanced kidneys are symmetrical in size.  No nephrolithiasis. No hydronephrosis or hydroureter.  No calcified ureteral calculi are noted bilaterally.  The uterus and adnexa are unremarkable.  Small amount of free fluid noted posterior cul-de-sac.  Bilateral distal ureter is unremarkable.  No calcified calculi are noted within the under distended urinary bladder.  There is a low- lying cecum with tip in the right anterior pelvis just above the urinary bladder.  There is no pericecal inflammation.  The terminal ileum is  unremarkable.  There is mild mass effect from the cecum on the right superior aspect of the urinary bladder.  Normal appendix is partially visualized in axial image 59 in the right mid pelvis.  IMPRESSION:  1.  No nephrolithiasis.  No hydronephrosis or hydroureter. 2.  No calcified ureteral calculi are noted. 3.  Status post cholecystectomy. 4.  There  is a low-lying cecum with tip in right anterior pelvis just above the urinary bladder. No pericecal inflammation.  Normal appendix.   Original Report Authenticated By: Natasha Mead, M.D.   1. Biliary obstruction   2. Elevated liver enzymes     MDM  Pt presenting with c/o abdominal pain in left upper quadrant, labs reveal elevated LFTs and elevated bilirubin and alk phos.  CT scan obtained and shows evidence of prior cholecystectomy- CBD enlarged- could be due to prior surgery or obstruction.  Lipase normal and pancreas appears normal on scan.  Pt feels much improved after pain and nausea meds.  D/w hospitalist at [facility], pt requests to be transferred there for further evaluation.  She was transferred by [facility] in stable condition.   Ethelda Chick, MD 08/06/12 [phone]

## 2013-02-15 ENCOUNTER — Encounter (HOSPITAL_BASED_OUTPATIENT_CLINIC_OR_DEPARTMENT_OTHER): Payer: Self-pay | Admitting: Emergency Medicine

## 2013-02-15 ENCOUNTER — Emergency Department (HOSPITAL_BASED_OUTPATIENT_CLINIC_OR_DEPARTMENT_OTHER): Payer: Self-pay

## 2013-02-15 ENCOUNTER — Emergency Department (HOSPITAL_BASED_OUTPATIENT_CLINIC_OR_DEPARTMENT_OTHER)
Admission: EM | Admit: 2013-02-15 | Discharge: 2013-02-15 | Disposition: A | Discharge status: Home / Self Care | Payer: Self-pay | Attending: Emergency Medicine | Admitting: Emergency Medicine

## 2013-02-15 DIAGNOSIS — I10 Essential (primary) hypertension: Secondary | ICD-10-CM | POA: Insufficient documentation

## 2013-02-15 DIAGNOSIS — Z79899 Other long term (current) drug therapy: Secondary | ICD-10-CM | POA: Insufficient documentation

## 2013-02-15 DIAGNOSIS — E119 Type 2 diabetes mellitus without complications: Secondary | ICD-10-CM | POA: Insufficient documentation

## 2013-02-15 DIAGNOSIS — Z87891 Personal history of nicotine dependence: Secondary | ICD-10-CM | POA: Insufficient documentation

## 2013-02-15 DIAGNOSIS — J45909 Unspecified asthma, uncomplicated: Secondary | ICD-10-CM | POA: Insufficient documentation

## 2013-02-15 DIAGNOSIS — R071 Chest pain on breathing: Secondary | ICD-10-CM | POA: Insufficient documentation

## 2013-02-15 DIAGNOSIS — R0789 Other chest pain: Secondary | ICD-10-CM

## 2013-02-15 DIAGNOSIS — G43909 Migraine, unspecified, not intractable, without status migrainosus: Secondary | ICD-10-CM | POA: Insufficient documentation

## 2013-02-15 DIAGNOSIS — Z87448 Personal history of other diseases of urinary system: Secondary | ICD-10-CM | POA: Insufficient documentation

## 2013-02-15 DIAGNOSIS — R209 Unspecified disturbances of skin sensation: Secondary | ICD-10-CM | POA: Insufficient documentation

## 2013-02-15 LAB — CBC WITH DIFFERENTIAL/PLATELET
BASOS PCT: 0 % (ref 0–1)
Basophils Absolute: 0 10*3/uL (ref 0.0–0.1)
EOS ABS: 0.3 10*3/uL (ref 0.0–0.7)
EOS PCT: 4 % (ref 0–5)
HCT: 34.2 % — ABNORMAL LOW (ref 36.0–46.0)
Hemoglobin: 11.5 g/dL — ABNORMAL LOW (ref 12.0–15.0)
LYMPHS ABS: 2.8 10*3/uL (ref 0.7–4.0)
Lymphocytes Relative: 36 % (ref 12–46)
MCH: 28.1 pg (ref 26.0–34.0)
MCHC: 33.6 g/dL (ref 30.0–36.0)
MCV: 83.6 fL (ref 78.0–100.0)
MONOS PCT: 8 % (ref 3–12)
Monocytes Absolute: 0.6 10*3/uL (ref 0.1–1.0)
NEUTROS PCT: 52 % (ref 43–77)
Neutro Abs: 4.1 10*3/uL (ref 1.7–7.7)
PLATELETS: 239 10*3/uL (ref 150–400)
RBC: 4.09 MIL/uL (ref 3.87–5.11)
RDW: 12.9 % (ref 11.5–15.5)
WBC: 7.8 10*3/uL (ref 4.0–10.5)

## 2013-02-15 LAB — COMPREHENSIVE METABOLIC PANEL
ALBUMIN: 3.8 g/dL (ref 3.5–5.2)
ALK PHOS: 71 U/L (ref 39–117)
ALT: 18 U/L (ref 0–35)
AST: 20 U/L (ref 0–37)
BUN: 14 mg/dL (ref 6–23)
CALCIUM: 9.3 mg/dL (ref 8.4–10.5)
CO2: 25 mEq/L (ref 19–32)
Chloride: 101 mEq/L (ref 96–112)
Creatinine, Ser: 1.1 mg/dL (ref 0.50–1.10)
GFR calc non Af Amer: 58 mL/min — ABNORMAL LOW (ref >90)
GFR, EST AFRICAN AMERICAN: 67 mL/min — AB (ref >90)
GLUCOSE: 102 mg/dL — AB (ref 70–99)
POTASSIUM: 3.6 meq/L — AB (ref 3.7–5.3)
SODIUM: 140 meq/L (ref 137–147)
TOTAL PROTEIN: 7.7 g/dL (ref 6.0–8.3)
Total Bilirubin: 0.4 mg/dL (ref 0.3–1.2)

## 2013-02-15 LAB — TROPONIN I: Troponin I: <0.3 ng/mL (ref <0.30)

## 2013-02-15 MED ORDER — ASPIRIN 81 MG PO CHEW
324.0000 mg | CHEWABLE_TABLET | Freq: Once | ORAL | Status: AC
  Administered 2013-02-15: 324 mg via ORAL
  Filled 2013-02-15: qty 4

## 2013-02-15 NOTE — ED Provider Notes (Signed)
CSN: [id number]     Arrival date & time 02/15/13  1650 History   First MD Initiated Contact with Patient 02/15/13 1720    This chart was scribed for Hilario Quarry, MD by Ladona Ridgel Day, ED scribe. This patient was seen in room MH05/MH05 and the patient's care was started at 1720.  Chief Complaint  Patient presents with  . Chest Pain   The history is provided by the patient. No language interpreter was used.   HPI Comments: Emma Bailey is a 48 y.o. female who presents to the Emergency Department complaining of intermittent CP, onset 2 weeks ago under left breast sharp/tight pain, worsened to a 6/10 constant pain today this AM, since 10 hours ago with associated left arm numbness (arm numbness began while at funeral today). No cardiac hx. No previous episodes similar to today. Never seen cardiologist. Never on blood thinners. Nothing makes worse or better. She took tylenol for a migraine today, no daily ASA; no ASA today. No hx blood clots in legs or lungs.   Quit smoking 3 years ago. She does not drink alcohol. Hx of Migraines and HTN Takes HCTZ and  Protonix.   Past Medical History  Diagnosis Date  . Hypertension   . Migraine   . Renal disorder   . Asthma   . Diabetes mellitus without complication    Past Surgical History  Procedure Laterality Date  . Cholecystectomy    . C sections    . Tubal ligation     No family history on file. History  Substance Use Topics  . Smoking status: Former Games developer  . Smokeless tobacco: Not on file  . Alcohol Use: No   OB History   Grav Para Term Preterm Abortions TAB SAB Ect Mult Living                 Review of Systems  Constitutional: Negative for fever and chills.  Respiratory: Negative for cough and shortness of breath.   Cardiovascular: Negative for chest pain.  Gastrointestinal: Negative for abdominal pain.  Musculoskeletal: Negative for back pain.  All other systems reviewed and are negative.   A complete 10 system review of  systems was obtained and all systems are negative except as noted in the HPI and PMH.   Allergies  Iodine  Home Medications   Current Outpatient Rx  Name  Route  Sig  Dispense  Refill  . hydrochlorothiazide (HYDRODIURIL) 25 MG tablet   Oral   Take 25 mg by mouth daily.         . candesartan (ATACAND) 16 MG tablet   Oral   Take 16 mg by mouth daily.         . cholecalciferol (VITAMIN D) 1000 UNITS tablet   Oral   Take 1,000 Units by mouth daily.         . pantoprazole (PROTONIX) 40 MG tablet   Oral   Take 1 tablet (40 mg total) by mouth daily.   30 tablet   0    Triage Vitals: BP 133/68  Pulse 63  Temp(Src) 98.4 F (36.9 C) (Oral)  Resp 18  Wt 205 lb (92.987 kg)  SpO2 97% Physical Exam  Nursing note and vitals reviewed. Constitutional: She is oriented to person, place, and time. She appears well-developed and well-nourished. No distress.  HENT:  Head: Normocephalic and atraumatic.  Eyes: Conjunctivae are normal. Right eye exhibits no discharge. Left eye exhibits no discharge.  Neck: Normal range of motion.  Cardiovascular: Normal rate.   Pulmonary/Chest: Effort normal. No respiratory distress.  Musculoskeletal: Normal range of motion. She exhibits no edema.  Neurological: She is alert and oriented to person, place, and time.  Skin: Skin is warm and dry.  Psychiatric: She has a normal mood and affect. Thought content normal.   ED Course  Procedures (including critical care time) DIAGNOSTIC STUDIES: Oxygen Saturation is 97% on room air, normal by my interpretation.    COORDINATION OF CARE: At 1700 Discussed treatment plan with patient which includes CXR, EKG, cardiac enzymes, blood work. Patient agrees.   Labs Review Labs Reviewed - No data to display Imaging Review No results found.  EKG Interpretation    Date/Time:  Sunday February 15 2013 16:58:41 EST Ventricular Rate:  58 PR Interval:  130 QRS Duration: 84 QT Interval:  396 QTC  Calculation: 388 R Axis:   62 Text Interpretation:  Sinus bradycardia Cannot rule out Anterior infarct , age undetermined Abnormal ECG Confirmed by Thaniel Coluccio MD, Ellah Otte ([id number]) on 02/15/2013 5:00:43 PM           MDM  48 year old female with reproducible chest pain with normal workup here and negative  Delta troponins. She is given return precautions and advised have close followup.    Hilario Quarryanielle S Suriyah Vergara, MD 02/15/13 [phone]

## 2013-02-15 NOTE — ED Notes (Signed)
Patient here with intermittent left sided chest pain x 2 weeks. Today reports that the pain will not go away, non-radiating, describes as sharp

## 2013-02-15 NOTE — ED Notes (Signed)
Denies fever, vomiting, diarrhea, cough, febrile illness.

## 2013-02-15 NOTE — Discharge Instructions (Signed)

## 2013-09-20 ENCOUNTER — Emergency Department (HOSPITAL_BASED_OUTPATIENT_CLINIC_OR_DEPARTMENT_OTHER)
Admission: EM | Admit: 2013-09-20 | Discharge: 2013-09-20 | Disposition: A | Discharge status: Home / Self Care | Payer: No Typology Code available for payment source | Attending: Emergency Medicine | Admitting: Emergency Medicine

## 2013-09-20 ENCOUNTER — Encounter (HOSPITAL_BASED_OUTPATIENT_CLINIC_OR_DEPARTMENT_OTHER): Payer: Self-pay | Admitting: Emergency Medicine

## 2013-09-20 ENCOUNTER — Emergency Department (HOSPITAL_BASED_OUTPATIENT_CLINIC_OR_DEPARTMENT_OTHER): Payer: No Typology Code available for payment source

## 2013-09-20 DIAGNOSIS — Z8742 Personal history of other diseases of the female genital tract: Secondary | ICD-10-CM | POA: Insufficient documentation

## 2013-09-20 DIAGNOSIS — Z87891 Personal history of nicotine dependence: Secondary | ICD-10-CM | POA: Insufficient documentation

## 2013-09-20 DIAGNOSIS — Z79899 Other long term (current) drug therapy: Secondary | ICD-10-CM | POA: Diagnosis not present

## 2013-09-20 DIAGNOSIS — S93401A Sprain of unspecified ligament of right ankle, initial encounter: Secondary | ICD-10-CM

## 2013-09-20 DIAGNOSIS — Y939 Activity, unspecified: Secondary | ICD-10-CM | POA: Diagnosis not present

## 2013-09-20 DIAGNOSIS — E119 Type 2 diabetes mellitus without complications: Secondary | ICD-10-CM | POA: Insufficient documentation

## 2013-09-20 DIAGNOSIS — J45909 Unspecified asthma, uncomplicated: Secondary | ICD-10-CM | POA: Diagnosis not present

## 2013-09-20 DIAGNOSIS — X58XXXA Exposure to other specified factors, initial encounter: Secondary | ICD-10-CM | POA: Insufficient documentation

## 2013-09-20 DIAGNOSIS — G43909 Migraine, unspecified, not intractable, without status migrainosus: Secondary | ICD-10-CM | POA: Diagnosis not present

## 2013-09-20 DIAGNOSIS — Y929 Unspecified place or not applicable: Secondary | ICD-10-CM | POA: Insufficient documentation

## 2013-09-20 DIAGNOSIS — S93409A Sprain of unspecified ligament of unspecified ankle, initial encounter: Secondary | ICD-10-CM | POA: Diagnosis not present

## 2013-09-20 DIAGNOSIS — I10 Essential (primary) hypertension: Secondary | ICD-10-CM | POA: Diagnosis not present

## 2013-09-20 DIAGNOSIS — M25579 Pain in unspecified ankle and joints of unspecified foot: Secondary | ICD-10-CM | POA: Diagnosis present

## 2013-09-20 MED ORDER — IBUPROFEN 800 MG PO TABS
800.0000 mg | ORAL_TABLET | Freq: Once | ORAL | Status: DC
  Filled 2013-09-20: qty 1

## 2013-09-20 MED ORDER — HYDROCODONE-ACETAMINOPHEN 5-325 MG PO TABS
1.0000 | ORAL_TABLET | Freq: Once | ORAL | Status: AC
  Administered 2013-09-20: 1 via ORAL
  Filled 2013-09-20: qty 1

## 2013-09-20 MED ORDER — TRAMADOL HCL 50 MG PO TABS: 50.0000 mg | ORAL_TABLET | Freq: Four times a day (QID) | ORAL | Status: AC | PRN

## 2013-09-20 NOTE — ED Notes (Signed)
Reports increased swelling and pain to right ankle. Denies injury

## 2013-09-20 NOTE — Discharge Instructions (Signed)

## 2013-09-20 NOTE — ED Notes (Signed)
Pt did not want to take ibuprofen dose previously ordered. Pt instructed at d/c the importance of taking an anti-inflammatory to decrease swelling. Pt voiced understanding.

## 2013-09-20 NOTE — ED Provider Notes (Signed)
CSN: [id number]     Arrival date & time 09/20/13  0009 History  This chart was scribed for Emma Christopher Smitty Cords, MD, by Yevette Edwards, ED Scribe. This patient was seen in room MH06/MH06 and the patient's care was started at 12:43 AM.   None    Chief Complaint  Patient presents with  . Ankle Pain    Patient is a 48 y.o. female presenting with ankle pain. The history is provided by the patient. No language interpreter was used.  Ankle Pain Location:  Ankle Time since incident:  6 hours Injury: no   Ankle location:  R ankle Pain details:    Quality:  Throbbing   Radiates to:  Does not radiate   Severity:  Moderate   Onset quality:  Sudden   Timing:  Constant   Progression:  Unable to specify Chronicity:  New Dislocation: no   Foreign body present:  Unable to specify Prior injury to area:  No Relieved by:  None tried Worsened by:  Bearing weight Ineffective treatments:  None tried Associated symptoms: no back pain   Risk factors: no concern for non-accidental trauma    HPI Comments: Emma Bailey is a 48 y.o. female, with a h/o HTN and DM, who presents to the Emergency Department complaining of right ankle pain which began approximately 6 hours ago.  She reports the pain is increased with palpation and movement. She has not treated her pain with any medication or ice. Ms. Lansky denies recent injury to the ankle. She reports a h/o renal disorder and states her ankles are swollen at baseline. She was wearing sandals today prior to the development of the pain.   Past Medical History  Diagnosis Date  . Hypertension   . Migraine   . Renal disorder   . Asthma   . Diabetes mellitus without complication    Past Surgical History  Procedure Laterality Date  . Cholecystectomy    . C sections    . Tubal ligation     No family history on file. History  Substance Use Topics  . Smoking status: Former Games developer  . Smokeless tobacco: Not on file  . Alcohol Use: No   No OB history  provided.  Review of Systems  Musculoskeletal: Positive for arthralgias. Negative for back pain.  All other systems reviewed and are negative.     Allergies  Iodine  Home Medications   Prior to Admission medications   Medication Sig Start Date End Date Taking? Authorizing Provider  furosemide (LASIX) 20 MG tablet Take 20 mg by mouth.   Yes Historical Provider, MD  candesartan (ATACAND) 16 MG tablet Take 16 mg by mouth daily.    Historical Provider, MD  cholecalciferol (VITAMIN D) 1000 UNITS tablet Take 1,000 Units by mouth daily.    Historical Provider, MD  hydrochlorothiazide (HYDRODIURIL) 25 MG tablet Take 25 mg by mouth daily.    Historical Provider, MD  pantoprazole (PROTONIX) 40 MG tablet Take 1 tablet (40 mg total) by mouth daily. 08/04/12   Charles B. Bernette Mayers, MD   Triage Vitals: BP 124/72  Pulse 65  Temp(Src) 98.2 F (36.8 C) (Oral)  Resp 16  Ht  (1.702 m)  Wt 203 lb (92.08 kg)  BMI 31.79 kg/m2  SpO2 100%  LMP 08/29/2013  Physical Exam  Constitutional: She is oriented to person, place, and time. She appears well-developed and well-nourished. No distress.  HENT:  Mouth/Throat: Oropharynx is clear and moist.  Eyes: Conjunctivae are normal. Pupils are  equal, round, and reactive to light.  Neck: Normal range of motion. Neck supple.  Cardiovascular: Normal rate, regular rhythm and intact distal pulses.   Intact distal pulses.  Cap refill less than 2 seconds.   Pulmonary/Chest: Effort normal and breath sounds normal. She has no wheezes. She has no rales.  Abdominal: Soft. Bowel sounds are normal. There is no tenderness.  Musculoskeletal: Normal range of motion.       Right ankle: She exhibits normal range of motion, no ecchymosis, no deformity, no laceration and normal pulse. No tenderness. No lateral malleolus, no medial malleolus, no posterior TFL, no head of 5th metatarsal and no proximal fibula tenderness found. Achilles tendon normal. Achilles tendon exhibits  normal Thompson's test results.  Neurological: She is alert and oriented to person, place, and time. She has normal reflexes.  Skin: Skin is warm and dry.  Psychiatric: She has a normal mood and affect.    ED Course  Procedures (including critical care time)  DIAGNOSTIC STUDIES: Oxygen Saturation is 100% on room air, normal by my interpretation.    COORDINATION OF CARE:  12:51 AM- Discussed treatment plan with patient, and the patient agreed to the plan. The plan includes pain medication and imaging.   Labs Review Labs Reviewed - No data to display  Imaging Review No results found.   EKG Interpretation None      MDM   Final diagnoses:  None    Ice elevation and ultram for pain.  ASO  I personally performed the services described in this documentation, which was scribed in my presence. The recorded information has been reviewed and is accurate.      Jasmine Awe, MD 09/20/13 [phone]

## 2013-11-25 ENCOUNTER — Other Ambulatory Visit: Payer: Self-pay | Admitting: Obstetrics and Gynecology

## 2013-11-25 ENCOUNTER — Other Ambulatory Visit (HOSPITAL_COMMUNITY)
Admission: RE | Admit: 2013-11-25 | Discharge: 2013-11-25 | Disposition: A | Discharge status: Home / Self Care | Payer: No Typology Code available for payment source | Source: Ambulatory Visit | Attending: Obstetrics and Gynecology | Admitting: Obstetrics and Gynecology

## 2013-11-25 DIAGNOSIS — Z01411 Encounter for gynecological examination (general) (routine) with abnormal findings: Secondary | ICD-10-CM | POA: Insufficient documentation

## 2013-11-25 DIAGNOSIS — Z1151 Encounter for screening for human papillomavirus (HPV): Secondary | ICD-10-CM | POA: Insufficient documentation

## 2013-11-27 LAB — CYTOLOGY - PAP

## 2014-02-27 ENCOUNTER — Encounter (HOSPITAL_BASED_OUTPATIENT_CLINIC_OR_DEPARTMENT_OTHER): Payer: Self-pay | Admitting: *Deleted

## 2014-02-27 ENCOUNTER — Emergency Department (HOSPITAL_BASED_OUTPATIENT_CLINIC_OR_DEPARTMENT_OTHER): Payer: No Typology Code available for payment source

## 2014-02-27 ENCOUNTER — Emergency Department (HOSPITAL_BASED_OUTPATIENT_CLINIC_OR_DEPARTMENT_OTHER)
Admission: EM | Admit: 2014-02-27 | Discharge: 2014-02-27 | Disposition: A | Discharge status: Home / Self Care | Payer: No Typology Code available for payment source | Attending: Emergency Medicine | Admitting: Emergency Medicine

## 2014-02-27 DIAGNOSIS — R0789 Other chest pain: Secondary | ICD-10-CM

## 2014-02-27 DIAGNOSIS — E119 Type 2 diabetes mellitus without complications: Secondary | ICD-10-CM | POA: Insufficient documentation

## 2014-02-27 DIAGNOSIS — I10 Essential (primary) hypertension: Secondary | ICD-10-CM | POA: Diagnosis not present

## 2014-02-27 DIAGNOSIS — R079 Chest pain, unspecified: Secondary | ICD-10-CM | POA: Insufficient documentation

## 2014-02-27 DIAGNOSIS — Z8742 Personal history of other diseases of the female genital tract: Secondary | ICD-10-CM | POA: Diagnosis not present

## 2014-02-27 DIAGNOSIS — M546 Pain in thoracic spine: Secondary | ICD-10-CM | POA: Insufficient documentation

## 2014-02-27 DIAGNOSIS — Z79899 Other long term (current) drug therapy: Secondary | ICD-10-CM | POA: Insufficient documentation

## 2014-02-27 DIAGNOSIS — M791 Myalgia: Secondary | ICD-10-CM | POA: Insufficient documentation

## 2014-02-27 DIAGNOSIS — Z87891 Personal history of nicotine dependence: Secondary | ICD-10-CM | POA: Insufficient documentation

## 2014-02-27 DIAGNOSIS — J45909 Unspecified asthma, uncomplicated: Secondary | ICD-10-CM | POA: Insufficient documentation

## 2014-02-27 DIAGNOSIS — M549 Dorsalgia, unspecified: Secondary | ICD-10-CM | POA: Diagnosis present

## 2014-02-27 DIAGNOSIS — G43909 Migraine, unspecified, not intractable, without status migrainosus: Secondary | ICD-10-CM | POA: Insufficient documentation

## 2014-02-27 DIAGNOSIS — M7918 Myalgia, other site: Secondary | ICD-10-CM

## 2014-02-27 LAB — CBC WITH DIFFERENTIAL/PLATELET
BASOS ABS: 0 10*3/uL (ref 0.0–0.1)
BASOS PCT: 0 % (ref 0–1)
EOS PCT: 5 % (ref 0–5)
Eosinophils Absolute: 0.3 10*3/uL (ref 0.0–0.7)
HCT: 34.5 % — ABNORMAL LOW (ref 36.0–46.0)
HEMOGLOBIN: 11.6 g/dL — AB (ref 12.0–15.0)
LYMPHS ABS: 2.3 10*3/uL (ref 0.7–4.0)
LYMPHS PCT: 43 % (ref 12–46)
MCH: 28 pg (ref 26.0–34.0)
MCHC: 33.6 g/dL (ref 30.0–36.0)
MCV: 83.3 fL (ref 78.0–100.0)
MONO ABS: 0.5 10*3/uL (ref 0.1–1.0)
Monocytes Relative: 10 % (ref 3–12)
Neutro Abs: 2.3 10*3/uL (ref 1.7–7.7)
Neutrophils Relative %: 42 % — ABNORMAL LOW (ref 43–77)
PLATELETS: 284 10*3/uL (ref 150–400)
RBC: 4.14 MIL/uL (ref 3.87–5.11)
RDW: 13.3 % (ref 11.5–15.5)
WBC: 5.4 10*3/uL (ref 4.0–10.5)

## 2014-02-27 LAB — URINE MICROSCOPIC-ADD ON

## 2014-02-27 LAB — SEDIMENTATION RATE: Sed Rate: 35 mm/hr — ABNORMAL HIGH (ref 0–22)

## 2014-02-27 LAB — URINALYSIS, ROUTINE W REFLEX MICROSCOPIC
Bilirubin Urine: NEGATIVE
GLUCOSE, UA: NEGATIVE mg/dL
KETONES UR: NEGATIVE mg/dL
LEUKOCYTES UA: NEGATIVE
NITRITE: NEGATIVE
PROTEIN: NEGATIVE mg/dL
Specific Gravity, Urine: 1.012 (ref 1.005–1.030)
Urobilinogen, UA: 1 mg/dL (ref 0.0–1.0)
pH: 6 (ref 5.0–8.0)

## 2014-02-27 LAB — BASIC METABOLIC PANEL
ANION GAP: 6 (ref 5–15)
BUN: 22 mg/dL (ref 6–23)
CO2: 27 mmol/L (ref 19–32)
Calcium: 8.9 mg/dL (ref 8.4–10.5)
Chloride: 101 mmol/L (ref 96–112)
Creatinine, Ser: 1.11 mg/dL — ABNORMAL HIGH (ref 0.50–1.10)
GFR calc Af Amer: 66 mL/min — ABNORMAL LOW (ref >90)
GFR calc non Af Amer: 57 mL/min — ABNORMAL LOW (ref >90)
GLUCOSE: 112 mg/dL — AB (ref 70–99)
Potassium: 3.2 mmol/L — ABNORMAL LOW (ref 3.5–5.1)
SODIUM: 134 mmol/L — AB (ref 135–145)

## 2014-02-27 LAB — TROPONIN I: Troponin I: <0.03 ng/mL (ref <0.031)

## 2014-02-27 LAB — D-DIMER, QUANTITATIVE (NOT AT ARMC)

## 2014-02-27 LAB — LIPASE, BLOOD: Lipase: 32 U/L (ref 11–59)

## 2014-02-27 MED ORDER — ASPIRIN 81 MG PO CHEW: 324.0000 mg | CHEWABLE_TABLET | Freq: Once | ORAL | Status: DC

## 2014-02-27 MED ORDER — HYDROMORPHONE HCL 1 MG/ML IJ SOLN
2.0000 mg | Freq: Once | INTRAMUSCULAR | Status: AC
  Administered 2014-02-27: 2 mg via INTRAVENOUS
  Filled 2014-02-27: qty 2

## 2014-02-27 MED ORDER — HYDROCODONE-ACETAMINOPHEN 5-325 MG PO TABS
2.0000 | ORAL_TABLET | Freq: Once | ORAL | Status: AC
  Administered 2014-02-27: 2 via ORAL
  Filled 2014-02-27: qty 2

## 2014-02-27 MED ORDER — CYCLOBENZAPRINE HCL 5 MG PO TABS: 5.0000 mg | ORAL_TABLET | Freq: Three times a day (TID) | ORAL | Status: DC | PRN

## 2014-02-27 MED ORDER — DIAZEPAM 5 MG PO TABS
5.0000 mg | ORAL_TABLET | Freq: Once | ORAL | Status: AC
  Administered 2014-02-27: 5 mg via ORAL
  Filled 2014-02-27: qty 1

## 2014-02-27 MED ORDER — SODIUM CHLORIDE 0.9 % IV BOLUS (SEPSIS)
1000.0000 mL | Freq: Once | INTRAVENOUS | Status: AC
  Administered 2014-02-27: 1000 mL via INTRAVENOUS

## 2014-02-27 MED ORDER — HYDROMORPHONE HCL 1 MG/ML IJ SOLN
1.0000 mg | INTRAMUSCULAR | Status: DC | PRN
  Administered 2014-02-27: 1 mg via INTRAVENOUS
  Filled 2014-02-27: qty 1

## 2014-02-27 NOTE — Discharge Instructions (Signed)
Return to the ED with any concerns including difficulty breathing, fever/chills, weakness of arms or legs, decreased level of alertness/lethargy, or any other alarming symptoms

## 2014-02-27 NOTE — ED Provider Notes (Signed)
CSN: [id number]     Arrival date & time 02/27/14  4098 History   First MD Initiated Contact with Patient 02/27/14 [phone]     Chief Complaint  Patient presents with  . Back Pain     (Consider location/radiation/quality/duration/timing/severity/associated sxs/prior Treatment) HPI Comments: Pt comes in with cc of back pain. Pt has diet controlled DM, HTN. She reports that her pain started last week. She had pain when she woke up from her sleep. Pt saw her pcp on Monday, Xrays were done, pain meds given. Pain responded initially, but on Thursday the pain returned. Pain initially intermittent, but now constant. Pain is located on the L side, below her shoulder blades and radiating front, below her breast. The pain is sharp, worse with inspiration and pt has some nausea. Pt states that in the middle of the night she turned, and had immense pain immediately/  Pt denies any cocaine hx, smoking hx. BP is well controlled. There is L sided numbness, going down to the elbow. No personal or family hx of musculoskeletal disorders. Pt never had had any cardiac workup.   ROS 10 Systems reviewed and are negative for acute change except as noted in the HPI.     Patient is a 49 y.o. female presenting with back pain. The history is provided by the patient.  Back Pain Associated symptoms: chest pain and numbness     Past Medical History  Diagnosis Date  . Hypertension   . Migraine   . Renal disorder   . Asthma   . Diabetes mellitus without complication    Past Surgical History  Procedure Laterality Date  . Cholecystectomy    . C sections    . Tubal ligation     No family history on file. History  Substance Use Topics  . Smoking status: Former Games developer  . Smokeless tobacco: Not on file  . Alcohol Use: No   OB History    No data available     Review of Systems  Cardiovascular: Positive for chest pain.  Musculoskeletal: Positive for back pain.  Neurological: Positive for numbness.  All  other systems reviewed and are negative.     Allergies  Iodine and Nsaids  Home Medications   Prior to Admission medications   Medication Sig Start Date End Date Taking? Authorizing Provider  HYDROcodone-ibuprofen (VICOPROFEN) 7.5-200 MG per tablet Take 1 tablet by mouth 3 (three) times daily.   Yes Historical Provider, MD  tiZANidine (ZANAFLEX) 2 MG tablet Take by mouth every 6 (six) hours as needed for muscle spasms.   Yes Historical Provider, MD  candesartan (ATACAND) 16 MG tablet Take 16 mg by mouth daily.    Historical Provider, MD  cholecalciferol (VITAMIN D) 1000 UNITS tablet Take 1,000 Units by mouth daily.    Historical Provider, MD  furosemide (LASIX) 20 MG tablet Take 20 mg by mouth.    Historical Provider, MD  hydrochlorothiazide (HYDRODIURIL) 25 MG tablet Take 25 mg by mouth daily.    Historical Provider, MD  pantoprazole (PROTONIX) 40 MG tablet Take 1 tablet (40 mg total) by mouth daily. 08/04/12   Charles B. Bernette Mayers, MD  traMADol (ULTRAM) 50 MG tablet Take 1 tablet (50 mg total) by mouth every 6 (six) hours as needed. 09/20/13   April K Palumbo-Rasch, MD   BP 114/51 mmHg  Pulse 62  Temp(Src) 98.1 F (36.7 C) (Oral)  Resp 24  Ht  (1.702 m)  Wt 213 lb (96.616 kg)  BMI 33.35 kg/m2  SpO2 100%  LMP 01/29/2014 Physical Exam  Constitutional: She is oriented to person, place, and time. She appears well-developed and well-nourished.  HENT:  Head: Normocephalic and atraumatic.  Eyes: EOM are normal. Pupils are equal, round, and reactive to light.  Neck: Neck supple.  Cardiovascular: Normal rate, regular rhythm, normal heart sounds and intact distal pulses.   No murmur heard. Pulmonary/Chest: Effort normal. No respiratory distress.  Abdominal: Soft. She exhibits no distension. There is no tenderness. There is no rebound and no guarding.  Musculoskeletal:  Pain reproduced with manipualaton and palpation around the scapula, L. She is able to abduct her shoulder and  forward flex. The shoulder is not warm to touch, no erythema.  Neurological: She is alert and oriented to person, place, and time.  Skin: Skin is warm and dry.  Nursing note and vitals reviewed.   ED Course  Procedures (including critical care time) Labs Review Labs Reviewed  CBC WITH DIFFERENTIAL/PLATELET  BASIC METABOLIC PANEL  TROPONIN I  URINALYSIS, ROUTINE W REFLEX MICROSCOPIC  LIPASE, BLOOD    Imaging Review No results found.   EKG Interpretation   Date/Time:  Saturday February 27 2014 07:08:28 EST Ventricular Rate:  47 PR Interval:  168 QRS Duration: 94 QT Interval:  466 QTC Calculation: 412 R Axis:   57 Text Interpretation:  Sinus bradycardia Otherwise normal ECG No  significant change since last tracing Confirmed by Yeilin Zweber, MD, Dashay Giesler  ([id number]) on 02/27/2014 7:36:25 AM      7:56 AM Pt's labs are all WNL. Results discussed with the patient and her husband. She is still in pain. So we will give 2 mg iv dilaudid. I am adding a scapular xray. Don't see any indication for CT right now -with a neg dimer. Pretest probability for PE, and Dissection was low to start off. Considered septic joint as well, but the exam really not indicative of joint involvement. Sed rate added. Plan is to get pain in control, and i will sign the care out to Dr. Karma GanjaLinker.  MDM   Final diagnoses:  Chest pain of uncertain etiology    Pt comes in with cc of back pain. Pt has no associated neurologic complains. Her back pain is in the left infra-scapular region and it radiates below her L breast. There is no rash seen in that area. Pt's shoulder exam is normal. Pain is reproduced with scapular manipulation and with movement of the shoulder, but the shoulder ROM is really not compromised. With chest pain, and back pain complains, ddx includes atypical ACS, dissection, PE. EKG is normal. CXR is normal. Dimer is normal. Pre test probability for PE, and dissection was very low to begin with.   Lipase was ordered, and that too is neg. Pt has no abd pain, and no pain below the diaphragm or in the flank region - renal stones and ovarian torsion unlikely.  Pt has received 1 mg of dilaudid, and oral meds. Will add more pain meds. Sed rate added. Will sign out to Dr. Karma GanjaLinker.   Derwood KaplanAnkit Beva Remund, MD 02/27/14 [phone]

## 2014-02-27 NOTE — ED Notes (Signed)
Pt calling for ride

## 2014-02-27 NOTE — ED Notes (Signed)
Pt's husband at bedside to drive- pt given Rx x 1 for flexeril- ambulated to BR without need for assist prior to d/c

## 2014-02-27 NOTE — ED Notes (Signed)
MD at bedside. 

## 2014-02-27 NOTE — ED Notes (Signed)
Pt c/o back pain x1 week that suddenly became severe at apprx. 5:30 this morning. Pt denies injury. Pt saw her PCP on Monday and received Rx for pain meds.

## 2014-02-27 NOTE — ED Notes (Signed)
I notified nurse of patient BP of 114/51.

## 2014-05-17 ENCOUNTER — Inpatient Hospital Stay (HOSPITAL_BASED_OUTPATIENT_CLINIC_OR_DEPARTMENT_OTHER)
Admission: EM | Admit: 2014-05-17 | Discharge: 2014-05-20 | DRG: 918 | Disposition: A | Discharge status: Home / Self Care | Payer: No Typology Code available for payment source | Attending: Internal Medicine | Admitting: Internal Medicine

## 2014-05-17 ENCOUNTER — Emergency Department (HOSPITAL_BASED_OUTPATIENT_CLINIC_OR_DEPARTMENT_OTHER): Payer: No Typology Code available for payment source

## 2014-05-17 ENCOUNTER — Encounter (HOSPITAL_BASED_OUTPATIENT_CLINIC_OR_DEPARTMENT_OTHER): Payer: Self-pay | Admitting: *Deleted

## 2014-05-17 DIAGNOSIS — Y929 Unspecified place or not applicable: Secondary | ICD-10-CM

## 2014-05-17 DIAGNOSIS — I129 Hypertensive chronic kidney disease with stage 1 through stage 4 chronic kidney disease, or unspecified chronic kidney disease: Secondary | ICD-10-CM | POA: Diagnosis present

## 2014-05-17 DIAGNOSIS — G43909 Migraine, unspecified, not intractable, without status migrainosus: Secondary | ICD-10-CM | POA: Diagnosis present

## 2014-05-17 DIAGNOSIS — Z888 Allergy status to other drugs, medicaments and biological substances status: Secondary | ICD-10-CM

## 2014-05-17 DIAGNOSIS — I10 Essential (primary) hypertension: Secondary | ICD-10-CM | POA: Diagnosis present

## 2014-05-17 DIAGNOSIS — J45909 Unspecified asthma, uncomplicated: Secondary | ICD-10-CM | POA: Diagnosis present

## 2014-05-17 DIAGNOSIS — E119 Type 2 diabetes mellitus without complications: Secondary | ICD-10-CM | POA: Diagnosis present

## 2014-05-17 DIAGNOSIS — R112 Nausea with vomiting, unspecified: Secondary | ICD-10-CM | POA: Diagnosis not present

## 2014-05-17 DIAGNOSIS — T391X1A Poisoning by 4-Aminophenol derivatives, accidental (unintentional), initial encounter: Principal | ICD-10-CM | POA: Diagnosis present

## 2014-05-17 DIAGNOSIS — K219 Gastro-esophageal reflux disease without esophagitis: Secondary | ICD-10-CM | POA: Diagnosis present

## 2014-05-17 DIAGNOSIS — Z9049 Acquired absence of other specified parts of digestive tract: Secondary | ICD-10-CM | POA: Diagnosis present

## 2014-05-17 DIAGNOSIS — Z91041 Radiographic dye allergy status: Secondary | ICD-10-CM

## 2014-05-17 DIAGNOSIS — T391X5A Adverse effect of 4-Aminophenol derivatives, initial encounter: Secondary | ICD-10-CM | POA: Diagnosis present

## 2014-05-17 DIAGNOSIS — K759 Inflammatory liver disease, unspecified: Secondary | ICD-10-CM

## 2014-05-17 DIAGNOSIS — T391X4S Poisoning by 4-Aminophenol derivatives, undetermined, sequela: Secondary | ICD-10-CM

## 2014-05-17 DIAGNOSIS — N182 Chronic kidney disease, stage 2 (mild): Secondary | ICD-10-CM | POA: Diagnosis present

## 2014-05-17 DIAGNOSIS — Z87891 Personal history of nicotine dependence: Secondary | ICD-10-CM

## 2014-05-17 DIAGNOSIS — E876 Hypokalemia: Secondary | ICD-10-CM | POA: Diagnosis present

## 2014-05-17 DIAGNOSIS — R52 Pain, unspecified: Secondary | ICD-10-CM

## 2014-05-17 DIAGNOSIS — R109 Unspecified abdominal pain: Secondary | ICD-10-CM | POA: Diagnosis present

## 2014-05-17 LAB — URINALYSIS, ROUTINE W REFLEX MICROSCOPIC
Bilirubin Urine: NEGATIVE
Glucose, UA: NEGATIVE mg/dL
HGB URINE DIPSTICK: NEGATIVE
Ketones, ur: NEGATIVE mg/dL
Leukocytes, UA: NEGATIVE
Nitrite: NEGATIVE
Protein, ur: 30 mg/dL — AB
SPECIFIC GRAVITY, URINE: 1.015 (ref 1.005–1.030)
UROBILINOGEN UA: 4 mg/dL — AB (ref 0.0–1.0)
pH: 7.5 (ref 5.0–8.0)

## 2014-05-17 LAB — URINE MICROSCOPIC-ADD ON

## 2014-05-17 LAB — PREGNANCY, URINE: Preg Test, Ur: NEGATIVE

## 2014-05-17 MED ORDER — DICYCLOMINE HCL 10 MG/ML IM SOLN
20.0000 mg | Freq: Once | INTRAMUSCULAR | Status: AC
  Administered 2014-05-17: 20 mg via INTRAMUSCULAR
  Filled 2014-05-17: qty 2

## 2014-05-17 MED ORDER — GI COCKTAIL ~~LOC~~
30.0000 mL | Freq: Once | ORAL | Status: AC
  Administered 2014-05-17: 30 mL via ORAL
  Filled 2014-05-17: qty 30

## 2014-05-17 NOTE — ED Provider Notes (Signed)
CSN: [id number]     Arrival date & time 05/17/14  2118 History  This chart was scribed for Emma Ensminger, MD by Gwenyth Ober, ED Scribe. This patient was seen in room MH11/MH11 and the patient's care was started at 11:27 PM.    Chief Complaint  Patient presents with  . Abdominal Pain   Patient is a 49 y.o. female presenting with abdominal pain. The history is provided by the patient. No language interpreter was used.  Abdominal Pain Pain location:  LUQ Pain quality: aching   Pain radiates to:  Does not radiate Pain severity:  Severe Onset quality:  Gradual Duration:  4 hours Timing:  Constant Progression:  Unchanged Chronicity:  Recurrent Context: not eating and not trauma   Relieved by:  Nothing Worsened by:  Nothing tried Ineffective treatments:  Acetaminophen Associated symptoms: nausea and vomiting   Associated symptoms: no diarrhea and no dysuria   Risk factors: no NSAID use     HPI Comments: Emma Bailey is a 49 y.o. female who presents to the Emergency Department complaining of constant, moderate LUQ abdominal pain that radiates to her back and started 4 hours ago. She states HA, nausea and vomiting as associated symptoms. Pt has tried Tylenol every 2.5 -3 hours for weeks and Topamax as scheduled with no relief of her chronic headaches.  Pt has a history of similar symptoms that occurred 2 years ago which she said were gastritis caused by tylenol ingestion. Her last BM was today. Pt denies dysuria and diarrhea as associated symptoms.   Past Medical History  Diagnosis Date  . Hypertension   . Migraine   . Renal disorder   . Asthma   . Diabetes mellitus without complication    Past Surgical History  Procedure Laterality Date  . Cholecystectomy    . C sections    . Tubal ligation     History reviewed. No pertinent family history. History  Substance Use Topics  . Smoking status: Former Games developer  . Smokeless tobacco: Not on file  . Alcohol Use: No   OB History     No data available     Review of Systems  Gastrointestinal: Positive for nausea, vomiting and abdominal pain. Negative for diarrhea.  Genitourinary: Negative for dysuria.  Neurological: Positive for headaches.  All other systems reviewed and are negative.  Allergies  Iodine and Nsaids  Home Medications   Prior to Admission medications   Medication Sig Start Date End Date Taking? Authorizing Provider  candesartan (ATACAND) 16 MG tablet Take 16 mg by mouth daily.    Historical Provider, MD  cholecalciferol (VITAMIN D) 1000 UNITS tablet Take 1,000 Units by mouth daily.    Historical Provider, MD  cyclobenzaprine (FLEXERIL) 5 MG tablet Take 1 tablet (5 mg total) by mouth 3 (three) times daily as needed for muscle spasms. 02/27/14   Jerelyn Scott, MD  furosemide (LASIX) 20 MG tablet Take 20 mg by mouth.    Historical Provider, MD  hydrochlorothiazide (HYDRODIURIL) 25 MG tablet Take 25 mg by mouth daily.    Historical Provider, MD  HYDROcodone-ibuprofen (VICOPROFEN) 7.5-200 MG per tablet Take 1 tablet by mouth 3 (three) times daily.    Historical Provider, MD  pantoprazole (PROTONIX) 40 MG tablet Take 1 tablet (40 mg total) by mouth daily. 08/04/12   Susy Frizzle, MD  tiZANidine (ZANAFLEX) 2 MG tablet Take by mouth every 6 (six) hours as needed for muscle spasms.    Historical Provider, MD  traMADol (ULTRAM) 50 MG  tablet Take 1 tablet (50 mg total) by mouth every 6 (six) hours as needed. 09/20/13   Jaycen Vercher, MD   BP 125/102 mmHg  Pulse 104  Temp(Src) 99.5 F (37.5 C) (Oral)  Resp 22  Ht 5\' 7"  (1.702 m)  Wt 216 lb (97.977 kg)  BMI 33.82 kg/m2  SpO2 99%  LMP 05/10/2014 Physical Exam  Constitutional: She is oriented to person, place, and time. She appears well-developed and well-nourished.  HENT:  Head: Normocephalic and atraumatic.  Mouth/Throat: Oropharynx is clear and moist.  Eyes: Conjunctivae and EOM are normal. Pupils are equal, round, and reactive to light.  Neck: Normal  range of motion. Neck supple. No tracheal deviation present.  Cardiovascular: Normal rate, regular rhythm and normal heart sounds.   Pulmonary/Chest: Effort normal and breath sounds normal. No respiratory distress. She has no wheezes. She has no rales.  Abdominal: Soft. Bowel sounds are normal. She exhibits no mass. There is tenderness. There is no rebound and no guarding.  Musculoskeletal: Normal range of motion. She exhibits no edema.  Neurological: She is alert and oriented to person, place, and time. She has normal reflexes.  Skin: Skin is warm and dry. She is not diaphoretic.  Psychiatric: She has a normal mood and affect. Her behavior is normal.  Nursing note and vitals reviewed.   ED Course  Procedures   DIAGNOSTIC STUDIES: Oxygen Saturation is 99% on RA, normal by my interpretation.    COORDINATION OF CARE: 11:31 PM Discussed treatment plan with pt. She agreed to plan.   Labs Review Labs Reviewed  PREGNANCY, URINE  URINALYSIS, ROUTINE W REFLEX MICROSCOPIC    Imaging Review No results found.   EKG Interpretation None      MDM   Final diagnoses:  None    Per poison control start N-acetyl cysteine as is chronic ingestion with symptoms  Results for orders placed or performed during the hospital encounter of 05/17/14  Urinalysis, Routine w reflex microscopic  Result Value Ref Range   Color, Urine YELLOW YELLOW   APPearance CLEAR CLEAR   Specific Gravity, Urine 1.015 1.005 - 1.030   pH 7.5 5.0 - 8.0   Glucose, UA NEGATIVE NEGATIVE mg/dL   Hgb urine dipstick NEGATIVE NEGATIVE   Bilirubin Urine NEGATIVE NEGATIVE   Ketones, ur NEGATIVE NEGATIVE mg/dL   Protein, ur 30 (A) NEGATIVE mg/dL   Urobilinogen, UA 4.0 (H) 0.0 - 1.0 mg/dL   Nitrite NEGATIVE NEGATIVE   Leukocytes, UA NEGATIVE NEGATIVE  Pregnancy, urine  Result Value Ref Range   Preg Test, Ur NEGATIVE NEGATIVE  Urine microscopic-add on  Result Value Ref Range   Squamous Epithelial / LPF FEW (A) RARE    WBC, UA 0-2 <3 WBC/hpf   RBC / HPF 0-2 <3 RBC/hpf   Bacteria, UA RARE RARE  CBC with Differential/Platelet  Result Value Ref Range   WBC 9.0 4.0 - 10.5 K/uL   RBC 4.05 3.87 - 5.11 MIL/uL   Hemoglobin 11.4 (L) 12.0 - 15.0 g/dL   HCT 16.1 (L) 09.6 - 04.5 %   MCV 83.0 78.0 - 100.0 fL   MCH 28.1 26.0 - 34.0 pg   MCHC 33.9 30.0 - 36.0 g/dL   RDW 40.9 81.1 - 91.4 %   Platelets 309 150 - 400 K/uL   Neutrophils Relative % 77 43 - 77 %   Neutro Abs 6.9 1.7 - 7.7 K/uL   Lymphocytes Relative 15 12 - 46 %   Lymphs Abs 1.3 0.7 - 4.0  K/uL   Monocytes Relative 7 3 - 12 %   Monocytes Absolute 0.6 0.1 - 1.0 K/uL   Eosinophils Relative 1 0 - 5 %   Eosinophils Absolute 0.1 0.0 - 0.7 K/uL   Basophils Relative 0 0 - 1 %   Basophils Absolute 0.0 0.0 - 0.1 K/uL  Comprehensive metabolic panel  Result Value Ref Range   Sodium 137 135 - 145 mmol/L   Potassium 3.1 (L) 3.5 - 5.1 mmol/L   Chloride 102 96 - 112 mmol/L   CO2 24 19 - 32 mmol/L   Glucose, Bld 132 (H) 70 - 99 mg/dL   BUN 12 6 - 23 mg/dL   Creatinine, Ser 1.61 (H) 0.50 - 1.10 mg/dL   Calcium 9.3 8.4 - 09.6 mg/dL   Total Protein 8.8 (H) 6.0 - 8.3 g/dL   Albumin 4.5 3.5 - 5.2 g/dL   AST 0454 (H) 0 - 37 U/L   ALT 511 (H) 0 - 35 U/L   Alkaline Phosphatase 315 (H) 39 - 117 U/L   Total Bilirubin 2.3 (H) 0.3 - 1.2 mg/dL   GFR calc non Af Amer 57 (L) >90 mL/min   GFR calc Af Amer 66 (L) >90 mL/min   Anion gap 11 5 - 15  Lipase, blood  Result Value Ref Range   Lipase 29 11 - 59 U/L  Acetaminophen level  Result Value Ref Range   Acetaminophen (Tylenol), Serum <10.0 (L) 10 - 30 ug/mL  Salicylate level  Result Value Ref Range   Salicylate Lvl <4.0 2.8 - 20.0 mg/dL  Acetaminophen level (recheck)  Result Value Ref Range   Acetaminophen (Tylenol), Serum <10.0 (L) 10 - 30 ug/mL   Dg Abd Acute W/chest  05/18/2014   CLINICAL DATA:  Constant left upper quadrant abdominal pain beginning 4 hr ago. Nausea and vomiting.  EXAM: DG ABDOMEN ACUTE W/ 1V  CHEST  COMPARISON:  Chest radiographs 02/27/2014. CT abdomen and pelvis 08/05/2012  FINDINGS: The cardiomediastinal silhouette is within normal limits. The lungs are well inflated and clear. There is no evidence of pleural effusion or pneumothorax. No acute osseous abnormality is identified.  There is no evidence of intraperitoneal free air. Gas is present in nondilated small and large bowel without evidence of obstruction. A small amount of colonic stool is present. Right upper quadrant surgical clips are noted. Mild thoracolumbar levoscoliosis is noted.  IMPRESSION: Negative abdominal radiographs.  No acute cardiopulmonary disease.   Electronically Signed   By: Sebastian Ache   On: 05/18/2014 00:20    Medications  acetylcysteine (ACETADOTE) 40 mg/mL load via infusion 14,700 mg (14,700 mg Intravenous Given 05/18/14 0131)    Followed by  acetylcysteine (ACETADOTE) 20,000 mg in dextrose 5 % 500 mL (40 mg/mL) infusion (not administered)  potassium chloride 10 mEq in 100 mL IVPB (not administered)  gi cocktail (Maalox,Lidocaine,Donnatal) (30 mLs Oral Given 05/17/14 2354)  dicyclomine (BENTYL) injection 20 mg (20 mg Intramuscular Given 05/17/14 2354)  morphine 4 MG/ML injection 4 mg (4 mg Intravenous Given 05/18/14 0126)  sodium chloride 0.9 % bolus 1,000 mL (0 mLs Intravenous Stopped 05/18/14 0143)   MDM Reviewed: previous chart, nursing note and vitals Reviewed previous: labs Interpretation: labs and x-ray (elevation of LFTs tylenol is negative.  NACPD on xray by me) Consults: admitting MD  CRITICAL CARE Performed by: Jasmine Awe Total critical care time: 60 minutes Critical care time was exclusive of separately billable procedures and treating other patients. Critical care was necessary to treat or  prevent imminent or life-threatening deterioration. Critical care was time spent personally by me on the following activities: development of treatment plan with patient and/or surrogate as well  as nursing, discussions with consultants, evaluation of patient's response to treatment, examination of patient, obtaining history from patient or surrogate, ordering and performing treatments and interventions, ordering and review of laboratory studies, ordering and review of radiographic studies, pulse oximetry and re-evaluation of patient's condition.    I personally performed the services described in this documentation, which was scribed in my presence. The recorded information has been reviewed and is accurate.     Cy BlamerApril Meha Vidrine, MD 05/18/14 [phone]

## 2014-05-17 NOTE — ED Notes (Signed)
Pt c/o abd pain c/o n/v x 2 hrs

## 2014-05-18 ENCOUNTER — Encounter (HOSPITAL_BASED_OUTPATIENT_CLINIC_OR_DEPARTMENT_OTHER): Payer: Self-pay | Admitting: Emergency Medicine

## 2014-05-18 ENCOUNTER — Emergency Department (HOSPITAL_BASED_OUTPATIENT_CLINIC_OR_DEPARTMENT_OTHER): Payer: No Typology Code available for payment source

## 2014-05-18 DIAGNOSIS — J45909 Unspecified asthma, uncomplicated: Secondary | ICD-10-CM | POA: Diagnosis present

## 2014-05-18 DIAGNOSIS — E119 Type 2 diabetes mellitus without complications: Secondary | ICD-10-CM | POA: Insufficient documentation

## 2014-05-18 DIAGNOSIS — Z9049 Acquired absence of other specified parts of digestive tract: Secondary | ICD-10-CM | POA: Diagnosis present

## 2014-05-18 DIAGNOSIS — J4520 Mild intermittent asthma, uncomplicated: Secondary | ICD-10-CM

## 2014-05-18 DIAGNOSIS — G43909 Migraine, unspecified, not intractable, without status migrainosus: Secondary | ICD-10-CM | POA: Diagnosis present

## 2014-05-18 DIAGNOSIS — T391X4S Poisoning by 4-Aminophenol derivatives, undetermined, sequela: Secondary | ICD-10-CM

## 2014-05-18 DIAGNOSIS — R112 Nausea with vomiting, unspecified: Secondary | ICD-10-CM | POA: Diagnosis present

## 2014-05-18 DIAGNOSIS — R1032 Left lower quadrant pain: Secondary | ICD-10-CM | POA: Diagnosis not present

## 2014-05-18 DIAGNOSIS — R1012 Left upper quadrant pain: Secondary | ICD-10-CM

## 2014-05-18 DIAGNOSIS — E876 Hypokalemia: Secondary | ICD-10-CM

## 2014-05-18 DIAGNOSIS — Z87891 Personal history of nicotine dependence: Secondary | ICD-10-CM | POA: Diagnosis not present

## 2014-05-18 DIAGNOSIS — T391X3S Poisoning by 4-Aminophenol derivatives, assault, sequela: Secondary | ICD-10-CM | POA: Diagnosis not present

## 2014-05-18 DIAGNOSIS — R109 Unspecified abdominal pain: Secondary | ICD-10-CM | POA: Diagnosis present

## 2014-05-18 DIAGNOSIS — T391X1S Poisoning by 4-Aminophenol derivatives, accidental (unintentional), sequela: Secondary | ICD-10-CM

## 2014-05-18 DIAGNOSIS — Z888 Allergy status to other drugs, medicaments and biological substances status: Secondary | ICD-10-CM | POA: Diagnosis not present

## 2014-05-18 DIAGNOSIS — I129 Hypertensive chronic kidney disease with stage 1 through stage 4 chronic kidney disease, or unspecified chronic kidney disease: Secondary | ICD-10-CM | POA: Diagnosis present

## 2014-05-18 DIAGNOSIS — N182 Chronic kidney disease, stage 2 (mild): Secondary | ICD-10-CM | POA: Diagnosis not present

## 2014-05-18 DIAGNOSIS — T391X1A Poisoning by 4-Aminophenol derivatives, accidental (unintentional), initial encounter: Secondary | ICD-10-CM | POA: Insufficient documentation

## 2014-05-18 DIAGNOSIS — K219 Gastro-esophageal reflux disease without esophagitis: Secondary | ICD-10-CM | POA: Diagnosis present

## 2014-05-18 DIAGNOSIS — I10 Essential (primary) hypertension: Secondary | ICD-10-CM

## 2014-05-18 DIAGNOSIS — Z91041 Radiographic dye allergy status: Secondary | ICD-10-CM | POA: Diagnosis not present

## 2014-05-18 DIAGNOSIS — Y929 Unspecified place or not applicable: Secondary | ICD-10-CM | POA: Diagnosis not present

## 2014-05-18 DIAGNOSIS — T391X5A Adverse effect of 4-Aminophenol derivatives, initial encounter: Secondary | ICD-10-CM | POA: Diagnosis present

## 2014-05-18 LAB — COMPREHENSIVE METABOLIC PANEL
ALBUMIN: 3.2 g/dL — AB (ref 3.5–5.2)
ALK PHOS: 280 U/L — AB (ref 39–117)
ALT: 511 U/L — AB (ref 0–35)
ALT: 639 U/L — ABNORMAL HIGH (ref 0–35)
AST: 1154 U/L — AB (ref 0–37)
AST: 1224 U/L — AB (ref 0–37)
Albumin: 4.5 g/dL (ref 3.5–5.2)
Alkaline Phosphatase: 315 U/L — ABNORMAL HIGH (ref 39–117)
Anion gap: 11 (ref 5–15)
Anion gap: 12 (ref 5–15)
BILIRUBIN TOTAL: 2.3 mg/dL — AB (ref 0.3–1.2)
BUN: 12 mg/dL (ref 6–23)
BUN: 9 mg/dL (ref 6–23)
CHLORIDE: 105 mmol/L (ref 96–112)
CO2: 23 mmol/L (ref 19–32)
CO2: 24 mmol/L (ref 19–32)
Calcium: 8.2 mg/dL — ABNORMAL LOW (ref 8.4–10.5)
Calcium: 9.3 mg/dL (ref 8.4–10.5)
Chloride: 102 mmol/L (ref 96–112)
Creatinine, Ser: 1.08 mg/dL (ref 0.50–1.10)
Creatinine, Ser: 1.11 mg/dL — ABNORMAL HIGH (ref 0.50–1.10)
GFR calc Af Amer: 66 mL/min — ABNORMAL LOW (ref >90)
GFR calc Af Amer: 68 mL/min — ABNORMAL LOW (ref >90)
GFR calc non Af Amer: 59 mL/min — ABNORMAL LOW (ref >90)
GFR, EST NON AFRICAN AMERICAN: 57 mL/min — AB (ref >90)
GLUCOSE: 132 mg/dL — AB (ref 70–99)
Glucose, Bld: 132 mg/dL — ABNORMAL HIGH (ref 70–99)
POTASSIUM: 3.1 mmol/L — AB (ref 3.5–5.1)
Potassium: 3.3 mmol/L — ABNORMAL LOW (ref 3.5–5.1)
SODIUM: 137 mmol/L (ref 135–145)
Sodium: 140 mmol/L (ref 135–145)
TOTAL PROTEIN: 8.8 g/dL — AB (ref 6.0–8.3)
Total Bilirubin: 2.8 mg/dL — ABNORMAL HIGH (ref 0.3–1.2)
Total Protein: 6.7 g/dL (ref 6.0–8.3)

## 2014-05-18 LAB — PROTIME-INR
INR: 1.21 (ref 0.00–1.49)
Prothrombin Time: 15.5 seconds — ABNORMAL HIGH (ref 11.6–15.2)

## 2014-05-18 LAB — HEPATITIS PANEL, ACUTE
HCV Ab: NEGATIVE
HEP B C IGM: NONREACTIVE
Hep A IgM: NONREACTIVE
Hepatitis B Surface Ag: NEGATIVE

## 2014-05-18 LAB — BILIRUBIN, DIRECT: BILIRUBIN DIRECT: 1.4 mg/dL — AB (ref 0.0–0.5)

## 2014-05-18 LAB — CBC WITH DIFFERENTIAL/PLATELET
BASOS ABS: 0 10*3/uL (ref 0.0–0.1)
Basophils Relative: 0 % (ref 0–1)
Eosinophils Absolute: 0.1 10*3/uL (ref 0.0–0.7)
Eosinophils Relative: 1 % (ref 0–5)
HEMATOCRIT: 33.6 % — AB (ref 36.0–46.0)
HEMOGLOBIN: 11.4 g/dL — AB (ref 12.0–15.0)
LYMPHS ABS: 1.3 10*3/uL (ref 0.7–4.0)
Lymphocytes Relative: 15 % (ref 12–46)
MCH: 28.1 pg (ref 26.0–34.0)
MCHC: 33.9 g/dL (ref 30.0–36.0)
MCV: 83 fL (ref 78.0–100.0)
MONO ABS: 0.6 10*3/uL (ref 0.1–1.0)
MONOS PCT: 7 % (ref 3–12)
NEUTROS ABS: 6.9 10*3/uL (ref 1.7–7.7)
Neutrophils Relative %: 77 % (ref 43–77)
Platelets: 309 10*3/uL (ref 150–400)
RBC: 4.05 MIL/uL (ref 3.87–5.11)
RDW: 13.1 % (ref 11.5–15.5)
WBC: 9 10*3/uL (ref 4.0–10.5)

## 2014-05-18 LAB — GLUCOSE, CAPILLARY: Glucose-Capillary: 117 mg/dL — ABNORMAL HIGH (ref 70–99)

## 2014-05-18 LAB — ACETAMINOPHEN LEVEL
Acetaminophen (Tylenol), Serum: <10 ug/mL — ABNORMAL LOW (ref 10–30)
Acetaminophen (Tylenol), Serum: <10 ug/mL — ABNORMAL LOW (ref 10–30)

## 2014-05-18 LAB — CBC
HCT: 31 % — ABNORMAL LOW (ref 36.0–46.0)
Hemoglobin: 10.6 g/dL — ABNORMAL LOW (ref 12.0–15.0)
MCH: 28.3 pg (ref 26.0–34.0)
MCHC: 34.2 g/dL (ref 30.0–36.0)
MCV: 82.7 fL (ref 78.0–100.0)
Platelets: 287 10*3/uL (ref 150–400)
RBC: 3.75 MIL/uL — AB (ref 3.87–5.11)
RDW: 13.5 % (ref 11.5–15.5)
WBC: 7 10*3/uL (ref 4.0–10.5)

## 2014-05-18 LAB — RAPID URINE DRUG SCREEN, HOSP PERFORMED
Amphetamines: NOT DETECTED
BARBITURATES: NOT DETECTED
BENZODIAZEPINES: NOT DETECTED
Cocaine: NOT DETECTED
Opiates: POSITIVE — AB
Tetrahydrocannabinol: NOT DETECTED

## 2014-05-18 LAB — LIPASE, BLOOD: Lipase: 29 U/L (ref 11–59)

## 2014-05-18 LAB — SALICYLATE LEVEL

## 2014-05-18 LAB — APTT: aPTT: 27 seconds (ref 24–37)

## 2014-05-18 MED ORDER — CYCLOBENZAPRINE HCL 10 MG PO TABS
5.0000 mg | ORAL_TABLET | Freq: Three times a day (TID) | ORAL | Status: DC | PRN
  Filled 2014-05-18: qty 1

## 2014-05-18 MED ORDER — MORPHINE SULFATE 4 MG/ML IJ SOLN
4.0000 mg | Freq: Once | INTRAMUSCULAR | Status: AC
  Administered 2014-05-18: 4 mg via INTRAVENOUS
  Filled 2014-05-18: qty 1

## 2014-05-18 MED ORDER — SODIUM CHLORIDE 0.9 % IV SOLN
INTRAVENOUS | Status: DC
  Administered 2014-05-18 – 2014-05-19 (×3): via INTRAVENOUS

## 2014-05-18 MED ORDER — POTASSIUM CHLORIDE 20 MEQ/15ML (10%) PO SOLN
40.0000 meq | Freq: Once | ORAL | Status: AC
  Administered 2014-05-18: 40 meq via ORAL
  Filled 2014-05-18: qty 30

## 2014-05-18 MED ORDER — TIZANIDINE HCL 2 MG PO TABS
2.0000 mg | ORAL_TABLET | Freq: Four times a day (QID) | ORAL | Status: DC | PRN
  Filled 2014-05-18: qty 1

## 2014-05-18 MED ORDER — ONDANSETRON HCL 4 MG/2ML IJ SOLN
4.0000 mg | Freq: Four times a day (QID) | INTRAMUSCULAR | Status: DC | PRN
  Administered 2014-05-19 – 2014-05-20 (×2): 4 mg via INTRAVENOUS
  Filled 2014-05-18 (×3): qty 2

## 2014-05-18 MED ORDER — SODIUM CHLORIDE 0.9 % IJ SOLN
3.0000 mL | Freq: Two times a day (BID) | INTRAMUSCULAR | Status: DC
  Administered 2014-05-18 – 2014-05-19 (×4): 3 mL via INTRAVENOUS

## 2014-05-18 MED ORDER — MORPHINE SULFATE 2 MG/ML IJ SOLN
2.0000 mg | INTRAMUSCULAR | Status: DC | PRN
  Administered 2014-05-18 – 2014-05-19 (×8): 2 mg via INTRAVENOUS
  Filled 2014-05-18 (×9): qty 1

## 2014-05-18 MED ORDER — IRBESARTAN 300 MG PO TABS
300.0000 mg | ORAL_TABLET | Freq: Every day | ORAL | Status: DC
  Administered 2014-05-18 – 2014-05-19 (×2): 300 mg via ORAL
  Filled 2014-05-18 (×5): qty 1

## 2014-05-18 MED ORDER — ACETYLCYSTEINE LOAD VIA INFUSION: 150.0000 mg/kg | Freq: Once | INTRAVENOUS | Status: DC

## 2014-05-18 MED ORDER — POTASSIUM CHLORIDE 10 MEQ/100ML IV SOLN: 10.0000 meq | Freq: Once | INTRAVENOUS | Status: DC

## 2014-05-18 MED ORDER — HEPARIN SODIUM (PORCINE) 5000 UNIT/ML IJ SOLN
5000.0000 [IU] | Freq: Three times a day (TID) | INTRAMUSCULAR | Status: DC
  Administered 2014-05-18 – 2014-05-20 (×5): 5000 [IU] via SUBCUTANEOUS
  Filled 2014-05-18 (×7): qty 1

## 2014-05-18 MED ORDER — ONDANSETRON HCL 4 MG/2ML IJ SOLN
INTRAMUSCULAR | Status: AC
  Administered 2014-05-18: 03:00:00
  Filled 2014-05-18: qty 2

## 2014-05-18 MED ORDER — ONDANSETRON HCL 4 MG PO TABS: 4.0000 mg | ORAL_TABLET | Freq: Four times a day (QID) | ORAL | Status: DC | PRN

## 2014-05-18 MED ORDER — DEXTROSE 5 % IV SOLN
15.0000 mg/kg/h | INTRAVENOUS | Status: DC
  Administered 2014-05-18 – 2014-05-19 (×3): 15 mg/kg/h via INTRAVENOUS
  Filled 2014-05-18 (×3): qty 100

## 2014-05-18 MED ORDER — ACETYLCYSTEINE LOAD VIA INFUSION
150.0000 mg/kg | Freq: Once | INTRAVENOUS | Status: AC
  Administered 2014-05-18: 14700 mg via INTRAVENOUS
  Filled 2014-05-18: qty 368

## 2014-05-18 MED ORDER — SODIUM CHLORIDE 0.9 % IV BOLUS (SEPSIS)
1000.0000 mL | Freq: Once | INTRAVENOUS | Status: AC
  Administered 2014-05-18: 1000 mL via INTRAVENOUS

## 2014-05-18 MED ORDER — PANTOPRAZOLE SODIUM 40 MG PO TBEC
40.0000 mg | DELAYED_RELEASE_TABLET | Freq: Every day | ORAL | Status: DC
  Administered 2014-05-18 – 2014-05-19 (×2): 40 mg via ORAL
  Filled 2014-05-18 (×3): qty 1

## 2014-05-18 MED ORDER — DEXTROSE 5 % IV SOLN: 15.0000 mg/kg/h | INTRAVENOUS | Status: DC

## 2014-05-18 MED ORDER — ALUM & MAG HYDROXIDE-SIMETH 200-200-20 MG/5ML PO SUSP
30.0000 mL | Freq: Four times a day (QID) | ORAL | Status: DC | PRN
  Administered 2014-05-19: 30 mL via ORAL
  Filled 2014-05-18 (×2): qty 30

## 2014-05-18 NOTE — Progress Notes (Signed)
PHARMACY - ACETADOTE  AST 1154 --> 1224 ALT 511 --> 639  Spoke with Poison Control and since LFTs went up slightly they recommend to continue Acetadote for 24 more hours at 15 mg/kg/hr. At the 22 hr mark redraw INR and LFTs. Poison Control will call the pharmacy tomorrow morning and f/u with repeat labs.  BudJennifer Clendenin, 1700 Rainbow BoulevardPharm.D., BCPS Clinical Pharmacist Pager: [phone] 05/18/2014 8:17 AM

## 2014-05-18 NOTE — H&P (Signed)
[facility] History and Physical  Torin Whisner KVQ:[id number] DOB: 06-16-1965 DOA: 05/17/2014  Referring physician: ED physician PCP: Alvina Filbert, MD  Specialists:   Chief Complaint: Nausea, vomiting, abdominal pain, headache  HPI: Emma Bailey is a 49 y.o. 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 She take 1000 mg almost every 3 hours for headache when she is awake. In the last 3 days, she slowed down taking tylenol because her headache is less severe than before. She denies suicidal ideation.   At about 7 PM yesterday, she developed severe abdominal pain. It is located in the left upper quadrant, constant, radiating to her back. It is associated with nausea and vomiting. She reports that she has been vomiting all day for many times. She does not have diarrhea. Currently her headache is mild, 3-4 out of 10 in severity per patient.  ROS: currently patient denies fever, chills, running nose, ear pain, cough, chest pain, SOB, dysuria, urgency, frequency, hematuria, skin rashes, joint pain or leg swelling. No unilateral weakness, numbness or tingling sensations. No vision change or hearing loss.  In ED, patient was found to have abnormal liver function, with the ALP 315, AST 1154, ALT 511, total bilirubin 2.3. Stable renal function, acetaminophen level negative, pregnancy test negative, urinalysis negative, WBC 9.0, temperature 99.5, heart rate 104, oxygen saturation 99%. Abdominal x-ray was negative for acute abnormalities. CT-abd/pelbis is negative for acute abnormalities. Patient is admitted to inpatient for further evaluation and treatment. Dr. Nicanor Alcon discussed with poison control, who suggested  to start NAC.   Review of Systems: As presented in the history of presenting illness, rest negative.  Where does patient live?  At home Can patient participate in ADLs? Yes  Allergy:  Allergies  Allergen Reactions  . Iodine Other (See Comments)    unknown  . Nsaids     Nephrotic syndrome     Past Medical History  Diagnosis Date  . Hypertension   . Migraine   . Renal disorder   . Asthma   . Diabetes mellitus without complication     Past Surgical History  Procedure Laterality Date  . Cholecystectomy    . C sections    . Tubal ligation      Social History:  reports that she has quit smoking. She does not have any smokeless tobacco history on file. She reports that she does not drink alcohol or use illicit drugs.  Family History: History reviewed. No pertinent family history.   Prior to Admission medications   Medication Sig Start Date End Date Taking? Authorizing Provider  candesartan (ATACAND) 16 MG tablet Take 16 mg by mouth daily.    Historical Provider, MD  cholecalciferol (VITAMIN D) 1000 UNITS tablet Take 1,000 Units by mouth daily.    Historical Provider, MD  cyclobenzaprine (FLEXERIL) 5 MG tablet Take 1 tablet (5 mg total) by mouth 3 (three) times daily as needed for muscle spasms. 02/27/14   Jerelyn Scott, MD  furosemide (LASIX) 20 MG tablet Take 20 mg by mouth.    Historical Provider, MD  hydrochlorothiazide (HYDRODIURIL) 25 MG tablet Take 25 mg by mouth daily.    Historical Provider, MD  HYDROcodone-ibuprofen (VICOPROFEN) 7.5-200 MG per tablet Take 1 tablet by mouth 3 (three)  times daily.    Historical Provider, MD  pantoprazole (PROTONIX) 40 MG tablet Take 1 tablet (40 mg total) by mouth daily. 08/04/12   Susy Frizzle, MD  tiZANidine (ZANAFLEX) 2 MG tablet Take by mouth every 6 (six) hours as needed for muscle spasms.    Historical Provider, MD  traMADol (ULTRAM) 50 MG tablet Take 1 tablet (50 mg total) by mouth every 6 (six) hours as needed. 09/20/13   April  Palumbo, MD    Physical Exam: Filed Vitals:   05/17/14 2131 05/18/14 0206 05/18/14 0222  BP: 125/102 129/72 130/70  Pulse: 104 71 74  Temp: 99.5 F (37.5 C) 98.6 F (37 C) 98.7 F (37.1 C)  TempSrc: Oral Oral Oral  Resp: Height:  (1.702 m)    Weight: 97.977 kg (216 lb)    SpO2: 99% 99% 100%   General:  in acute distress due to pain HEENT:       Eyes: PERRL, EOMI, no scleral icterus       ENT: No discharge from the ears and nose, no pharynx injection, no tonsillar enlargement.        Neck: No JVD, no bruit, no mass felt. Cardiac: S1/S2, RRR, No murmurs, No gallops or rubs Pulm: Good air movement bilaterally. Clear to auscultation bilaterally. No rales, wheezing, rhonchi or rubs. Abd: Soft, nondistended, severe tender over LUQ, no rebound pain, no organomegaly, BS present Ext: No edema bilaterally. 2+DP/PT pulse bilaterally Musculoskeletal: No joint deformities, erythema, or stiffness, ROM full Skin: No rashes.  Neuro: Alert and oriented X3, cranial nerves II-XII grossly intact, muscle strength 5/5 in all extremeties, sensation to light touch intact. Brachial reflex 2+ bilaterally. Knee reflex 1+ bilaterally. Negative Babinski's sign. Normal finger to nose test. Psych: Patient is not psychotic, no suicidal or hemocidal ideation.  Labs on Admission:  Basic Metabolic Panel:  Recent Labs Lab 05/17/14 2350  NA 137  K 3.1*  CL 102  CO2 24  GLUCOSE 132*  BUN 12  CREATININE 1.11*  CALCIUM 9.3   Liver Function Tests:  Recent Labs Lab 05/17/14 2350  AST 1154*  ALT 511*  ALKPHOS 315*  BILITOT 2.3*  PROT 8.8*  ALBUMIN 4.5    Recent Labs Lab 05/17/14 2350  LIPASE 29   No results for input(s): AMMONIA in the last 168 hours. CBC:  Recent Labs Lab 05/17/14 2350  WBC 9.0  NEUTROABS 6.9  HGB 11.4*  HCT 33.6*  MCV 83.0  PLT 309   Cardiac Enzymes: No results for input(s): CKTOTAL, CKMB, CKMBINDEX, TROPONINI in the last 168 hours.  BNP (last 3  results) No results for input(s): BNP in the last 8760 hours.  ProBNP (last 3 results) No results for input(s): PROBNP in the last 8760 hours.  CBG: No results for input(s): GLUCAP in the last 168 hours.  Radiological Exams on Admission: Dg Abd Acute W/chest  05/18/2014   CLINICAL DATA:  Constant left upper quadrant abdominal pain beginning 4 hr ago. Nausea and vomiting.  EXAM: DG ABDOMEN ACUTE W/ 1V CHEST  COMPARISON:  Chest radiographs 02/27/2014. CT abdomen and pelvis 08/05/2012  FINDINGS: The cardiomediastinal silhouette is within normal limits. The lungs are well inflated and clear. There is no evidence of pleural effusion or pneumothorax. No acute osseous abnormality is identified.  There is no evidence of intraperitoneal free air. Gas is present in nondilated small and large bowel without evidence of obstruction. A small amount of colonic stool is present. Right upper quadrant  surgical clips are noted. Mild thoracolumbar levoscoliosis is noted.  IMPRESSION: Negative abdominal radiographs.  No acute cardiopulmonary disease.   Electronically Signed   By: Sebastian AcheAllen  Grady   On: 05/18/2014 00:20    EKG: Independently reviewed.  Abnormal findings:  Will get one.   Assessment/Plan Principal Problem:   Abdominal pain Active Problems:   Tylenol overdose   Hypertension   Migraine   Asthma   Diabetes mellitus without complication   CKD (chronic kidney disease), stage II  Acetaminophen overdose: she took excessive doses of Tylenol. Denies suicidal ideation, and claims just trying to stop her migraine headache. LFTs abnormal on admission: AST: 1154 ALT: 83, ALP: 511, TBili:2.3. Acetaminophen level <15.0.  CT-abd/pelvis is negative acute abnormalities. Her abdominal pain is most likely due to fevers damage secondary to acetaminophen toxicity. -admit to tele  -contacted pharmacy and [place] poison control -Start IV NAC  -CMET and INR -IVF: ns 125 cc/h -I/Os  -EKG -Checking hepatitis  panel -Protonix -May consult GI if LFT worsening  CKD-II: Baseline creatinine 1.1-1.2, her creatinine is 1.11 on admission, which is at baseline.  -follow-up renal function by CMP  Migraine headache: Currently headache is mild. -On morphine for abdominal pain, which should cover headache  Hyperkalemia Potassium 3.1 -Repleted  Diabetes mellitus: Not on medications at home. No A1c on record. -Sliding scale insulin -check A1c  Hypertension:  -hold diuretics(hydrochlorothiazide and Lasix) since patient needed IV fluid -Irbesartan   DVT ppx: SQ Heparin   Code Status: Full code Family Communication: None at bed side.     Disposition Plan: Admit to inpatient   Date of Service 05/18/2014    Lorretta HarpIU, Tevita Gomer [facility] Pager [phone]  If 7PM-7AM, please contact night-coverage [email or web address] Password [email or web address] 05/18/2014, 3:55 AM

## 2014-05-18 NOTE — Progress Notes (Signed)
New Admission Note:  Via carelin  Arrival Method:  Via carelink Mental Orientation: A&O x4 Telemetry: nsr Assessment: Completed Skin: dry and intact IV: NS@ 124 Pain: 10/10 Tubes: N/A Safety Measures: Safety Fall Prevention Plan has been given, discussed and signed Admission: Completed [address] Orientation: Patient has been orientated to the room, unit and staff.  Family: NONE PRESENT  Orders have been reviewed and implemented. Will continue to monitor the patient. Call light has been placed within reach and bed alarm has been activated.   De Nursey Selinda Korzeniewski BSN, Publishing copyN  Phone number: [phone]

## 2014-05-18 NOTE — Progress Notes (Signed)
Patient seen and evaluated. Also seen and evaluated earlier this a.m. by my associate. Plan is to continue Acetadote per poison control recommendations. Given slight elevation of LFTs the plan will be to continue a sedative dose for 24 more hours please see last pharmacology note.  Gen: Patient is somnolent but arousable, answers questions appropriately, in no acute distress Cardiovascular: S1 and S2 present no rubs Pulmonary: No increased work of breathing, no wheezes  We'll reassess next a.m.  Penny PiaVEGA, [place] [facility].

## 2014-05-19 DIAGNOSIS — T391X3S Poisoning by 4-Aminophenol derivatives, assault, sequela: Secondary | ICD-10-CM

## 2014-05-19 LAB — GLUCOSE, CAPILLARY
GLUCOSE-CAPILLARY: 107 mg/dL — AB (ref 70–99)
Glucose-Capillary: 110 mg/dL — ABNORMAL HIGH (ref 70–99)
Glucose-Capillary: 184 mg/dL — ABNORMAL HIGH (ref 70–99)

## 2014-05-19 LAB — HEMOGLOBIN A1C
Hgb A1c MFr Bld: 6.1 % — ABNORMAL HIGH (ref 4.8–5.6)
MEAN PLASMA GLUCOSE: 128 mg/dL

## 2014-05-19 LAB — HIV ANTIBODY (ROUTINE TESTING W REFLEX): HIV SCREEN 4TH GENERATION: NONREACTIVE

## 2014-05-19 LAB — COMPREHENSIVE METABOLIC PANEL
ALK PHOS: 327 U/L — AB (ref 39–117)
ALT: 497 U/L — ABNORMAL HIGH (ref 0–35)
AST: 391 U/L — AB (ref 0–37)
Albumin: 2.9 g/dL — ABNORMAL LOW (ref 3.5–5.2)
Anion gap: 9 (ref 5–15)
BILIRUBIN TOTAL: 3.6 mg/dL — AB (ref 0.3–1.2)
BUN: <5 mg/dL — ABNORMAL LOW (ref 6–23)
CHLORIDE: 108 mmol/L (ref 96–112)
CO2: 23 mmol/L (ref 19–32)
Calcium: 8.1 mg/dL — ABNORMAL LOW (ref 8.4–10.5)
Creatinine, Ser: 1.04 mg/dL (ref 0.50–1.10)
GFR calc Af Amer: 71 mL/min — ABNORMAL LOW (ref >90)
GFR, EST NON AFRICAN AMERICAN: 62 mL/min — AB (ref >90)
Glucose, Bld: 112 mg/dL — ABNORMAL HIGH (ref 70–99)
Potassium: 2.9 mmol/L — ABNORMAL LOW (ref 3.5–5.1)
Sodium: 140 mmol/L (ref 135–145)
Total Protein: 6.3 g/dL (ref 6.0–8.3)

## 2014-05-19 LAB — PROTIME-INR
INR: 1.18 (ref 0.00–1.49)
PROTHROMBIN TIME: 15.1 s (ref 11.6–15.2)

## 2014-05-19 MED ORDER — POTASSIUM CHLORIDE CRYS ER 20 MEQ PO TBCR
40.0000 meq | EXTENDED_RELEASE_TABLET | ORAL | Status: AC
  Administered 2014-05-19 (×2): 40 meq via ORAL
  Filled 2014-05-19 (×2): qty 2

## 2014-05-19 MED ORDER — MORPHINE SULFATE 2 MG/ML IJ SOLN
1.0000 mg | INTRAMUSCULAR | Status: DC | PRN
  Administered 2014-05-19 – 2014-05-20 (×3): 1 mg via INTRAVENOUS
  Filled 2014-05-19 (×4): qty 1

## 2014-05-19 NOTE — Progress Notes (Signed)
[facility] PROGRESS NOTE  Emma Bailey ZOX:[id number] DOB: 1965-04-24 DOA: 05/17/2014 PCP: Alvina FilbertHUSSEY, MICHAEL B, MD  Assessment/Plan: 1. Tylenol overdose -this was to treat her HA and not a suicide attempt -s/p NAC protocol, LFTs improving cmet in am -counseled  2. Abd pain/L flank pain -due to above and come muscular back pain -Ct Abd normal -advance diet, cut down IVf and morphine -UA normal too  3. Elevated LFTs -due to 1, improving -hepatitis panel negative -s/p cholecystectomy  4. Hypokalemia -replace  5. Htn -continue Avapro  DVt proph:  Hep SQ  Code Status: Full Code Family Communication: none at bedside (indicate person spoken with, relationship, and if by phone, the number) Disposition Plan: home in 1-2days  HPI/Subjective: Still with some L sided flank pain, improving, no N/V  Objective: Filed Vitals:   05/19/14 0959  BP: 109/53  Pulse: 59  Temp: 98.7 F (37.1 C)  Resp: 18    Intake/Output Summary (Last 24 hours) at 05/19/14 1328 Last data filed at 05/19/14 1000  Gross per 24 hour  Intake 1256.48 ml  Output   2625 ml  Net -1368.52 ml   Filed Weights   05/17/14 2131 05/18/14 0400 05/18/14 2115  Weight: 97.977 kg (216 lb) 95.8 kg (211 lb 3.2 oz) 96.8 kg (213 lb 6.5 oz)    Exam:   General: AAox3, uncomfortable appearing  Cardiovascular: S1S2/RRR  Respiratory: CTAB  Abdomen: soft, NT, BS present, no CVA tenderness  Musculoskeletal: no edema c/c   Data Reviewed: Basic Metabolic Panel:  Recent Labs Lab 05/17/14 2350 05/18/14 0550 05/19/14 0835  NA 137 140 140  K 3.1* 3.3* 2.9*  CL 102 105 108  CO2 24 23 23   GLUCOSE 132* 132* 112*  BUN 12 9 <5*  CREATININE 1.11* 1.08 1.04  CALCIUM 9.3 8.2* 8.1*   Liver Function Tests:  Recent Labs Lab 05/17/14 2350 05/18/14 0550 05/19/14 0835  AST 1154* 1224* 391*  ALT 511* 639* 497*  ALKPHOS 315* 280* 327*  BILITOT 2.3* 2.8* 3.6*  PROT 8.8* 6.7 6.3  ALBUMIN 4.5 3.2* 2.9*     Recent Labs Lab 05/17/14 2350  LIPASE 29   No results for input(s): AMMONIA in the last 168 hours. CBC:  Recent Labs Lab 05/17/14 2350 05/18/14 0550  WBC 9.0 7.0  NEUTROABS 6.9  --   HGB 11.4* 10.6*  HCT 33.6* 31.0*  MCV 83.0 82.7  PLT 309 287   Cardiac Enzymes: No results for input(s): CKTOTAL, CKMB, CKMBINDEX, TROPONINI in the last 168 hours. BNP (last 3 results) No results for input(s): BNP in the last 8760 hours.  ProBNP (last 3 results) No results for input(s): PROBNP in the last 8760 hours.  CBG:  Recent Labs Lab 05/18/14 0753 05/19/14 0754 05/19/14 1202  GLUCAP 117* 110* 107*    No results found for this or any previous visit (from the past 240 hour(s)).   Studies: Ct Abdomen Pelvis Wo Contrast  05/18/2014   CLINICAL DATA:  Left upper quadrant pain for 6 hr. Nausea and vomiting.  EXAM: CT ABDOMEN AND PELVIS WITHOUT CONTRAST  TECHNIQUE: Multidetector CT imaging of the abdomen and pelvis was performed following the standard protocol without IV contrast.  COMPARISON:  08/05/2012  FINDINGS: Minimal scarring and atelectasis are noted in the lung bases. There is no pleural effusion. The gallbladder is surgically absent. Dilatation of the common bile duct with tapering distally does not appear significantly changed and is likely related to prior cholecystectomy. The liver, spleen, adrenal glands, kidneys,  and pancreas have an unremarkable unenhanced appearance.  There is no evidence of bowel obstruction. Appendix is unremarkable. No bowel wall thickening or inflammation is identified. Bladder is unremarkable. Uterus and ovaries are identified.  There is mild atherosclerotic vascular calcification. Elongated left external iliac lymph node measuring 9 mm in short axis is unchanged. No free fluid is seen. Small region of heterogeneous sclerosis and lucency in the L2 vertebral body is unchanged, possibly an underlying hemangioma. No acute osseous abnormality is identified.   IMPRESSION: No acute abnormality identified in the abdomen or pelvis.   Electronically Signed   By: Sebastian Ache   On: 05/18/2014 05:47   Dg Abd Acute W/chest  05/18/2014   CLINICAL DATA:  Constant left upper quadrant abdominal pain beginning 4 hr ago. Nausea and vomiting.  EXAM: DG ABDOMEN ACUTE W/ 1V CHEST  COMPARISON:  Chest radiographs 02/27/2014. CT abdomen and pelvis 08/05/2012  FINDINGS: The cardiomediastinal silhouette is within normal limits. The lungs are well inflated and clear. There is no evidence of pleural effusion or pneumothorax. No acute osseous abnormality is identified.  There is no evidence of intraperitoneal free air. Gas is present in nondilated small and large bowel without evidence of obstruction. A small amount of colonic stool is present. Right upper quadrant surgical clips are noted. Mild thoracolumbar levoscoliosis is noted.  IMPRESSION: Negative abdominal radiographs.  No acute cardiopulmonary disease.   Electronically Signed   By: Sebastian Ache   On: 05/18/2014 00:20    Scheduled Meds: . heparin  5,000 Units Subcutaneous 3 times per day  . irbesartan  300 mg Oral Daily  . pantoprazole  40 mg Oral Daily  . potassium chloride  10 mEq Intravenous Once  . sodium chloride  3 mL Intravenous Q12H   Continuous Infusions: . sodium chloride 75 mL/hr at 05/19/14 1221   Antibiotics Given (last 72 hours)    None      Principal Problem:   Abdominal pain Active Problems:   Tylenol overdose   Hypertension   Migraine   Asthma   Diabetes mellitus without complication   CKD (chronic kidney disease), stage II   Hypokalemia   Overdose on Tylenol   Unintentional Tylenol overdose    Time spent:    [facility]  [facility] Pager [phone]. If 7PM-7AM, please contact night-coverage at [email or web address], password [facility] 05/19/2014, 1:28 PM  LOS: 1 day

## 2014-05-19 NOTE — Progress Notes (Addendum)
PHARMACY - ACETADOTE  AST 1154 --> 1224 --> 391 ALT 511 --> 639 --> 497 INR 1.18  4/20: Spoke with Poison Control and reviewed labs from this morning. Their recommendation is to discontinue Acetadote at this time. They are closing her case but we can call with any questions. Pharmacy signing off, please re-consult if needed.  4/19: Spoke with Poison Control and since LFTs went up slightly they recommend to continue Acetadote for 24 more hours at 15 mg/kg/hr. At the 22 hr mark redraw INR and LFTs. Poison Control will call the pharmacy tomorrow morning and f/u with repeat labs.  StephanJennifer Bromley, 1700 Rainbow BoulevardPharm.D., BCPS Clinical Pharmacist Pager: [phone] 05/19/2014 10:27 AM

## 2014-05-20 DIAGNOSIS — R1032 Left lower quadrant pain: Secondary | ICD-10-CM

## 2014-05-20 DIAGNOSIS — E119 Type 2 diabetes mellitus without complications: Secondary | ICD-10-CM | POA: Insufficient documentation

## 2014-05-20 LAB — COMPREHENSIVE METABOLIC PANEL
ALK PHOS: 301 U/L — AB (ref 39–117)
ALT: 344 U/L — ABNORMAL HIGH (ref 0–35)
AST: 207 U/L — ABNORMAL HIGH (ref 0–37)
Albumin: 2.9 g/dL — ABNORMAL LOW (ref 3.5–5.2)
Anion gap: 9 (ref 5–15)
BILIRUBIN TOTAL: 4.6 mg/dL — AB (ref 0.3–1.2)
BUN: <5 mg/dL — ABNORMAL LOW (ref 6–23)
CALCIUM: 8.2 mg/dL — AB (ref 8.4–10.5)
CO2: 22 mmol/L (ref 19–32)
Chloride: 109 mmol/L (ref 96–112)
Creatinine, Ser: 0.96 mg/dL (ref 0.50–1.10)
GFR calc Af Amer: 79 mL/min — ABNORMAL LOW (ref >90)
GFR calc non Af Amer: 68 mL/min — ABNORMAL LOW (ref >90)
GLUCOSE: 87 mg/dL (ref 70–99)
Potassium: 3.6 mmol/L (ref 3.5–5.1)
Sodium: 140 mmol/L (ref 135–145)
Total Protein: 6 g/dL (ref 6.0–8.3)

## 2014-05-20 LAB — CBC
HCT: 29.4 % — ABNORMAL LOW (ref 36.0–46.0)
Hemoglobin: 9.9 g/dL — ABNORMAL LOW (ref 12.0–15.0)
MCH: 28.3 pg (ref 26.0–34.0)
MCHC: 33.7 g/dL (ref 30.0–36.0)
MCV: 84 fL (ref 78.0–100.0)
Platelets: 234 10*3/uL (ref 150–400)
RBC: 3.5 MIL/uL — AB (ref 3.87–5.11)
RDW: 13.8 % (ref 11.5–15.5)
WBC: 5.8 10*3/uL (ref 4.0–10.5)

## 2014-05-20 LAB — VITAMIN B12: VITAMIN B 12: 694 pg/mL (ref 211–911)

## 2014-05-20 LAB — GLUCOSE, CAPILLARY
GLUCOSE-CAPILLARY: 101 mg/dL — AB (ref 70–99)
GLUCOSE-CAPILLARY: 94 mg/dL (ref 70–99)

## 2014-05-20 MED ORDER — HYDROCODONE-ACETAMINOPHEN 5-500 MG PO TABS: 1.0000 | ORAL_TABLET | Freq: Four times a day (QID) | ORAL | Status: AC | PRN

## 2014-05-20 MED ORDER — FAMOTIDINE 20 MG PO TABS
20.0000 mg | ORAL_TABLET | Freq: Two times a day (BID) | ORAL | Status: DC | PRN
  Filled 2014-05-20: qty 1

## 2014-05-20 MED ORDER — FAMOTIDINE 20 MG PO TABS: 20.0000 mg | ORAL_TABLET | Freq: Every day | ORAL | Status: AC

## 2014-05-20 MED ORDER — PANTOPRAZOLE SODIUM 40 MG PO TBEC: 40.0000 mg | DELAYED_RELEASE_TABLET | Freq: Every day | ORAL | Status: AC

## 2014-05-20 NOTE — Care Management Note (Signed)
CARE MANAGEMENT NOTE 05/20/2014  Patient:  Emma Bailey,[name]   Account Number:  [id number]  Date Initiated:  05/20/2014  Documentation initiated by:  Johny ShockOYAL,Eulogia Dismore  Subjective/Objective Assessment:   CM following for progression and d/c planning     Action/Plan:   Received consult for PCP for this pt , however PCP is listed on pt record. Phone number for [facility] added to d/c instructions for pt to call and obtain the names of local MDs accepting new pts. Pt may also call her insurance provider.   Anticipated DC Date:  05/20/2014   Anticipated DC Plan:  HOME/SELF CARE         Choice offered to / List presented to:             Status of service:  Completed, signed off Medicare Important Message given?  NO (If response is "NO", the following Medicare IM given date fields will be blank) Date Medicare IM given:   Medicare IM given by:   Date Additional Medicare IM given:   Additional Medicare IM given by:    Discharge Disposition:  HOME/SELF CARE  Per UR Regulation:    If discussed at Long Length of Stay Meetings, dates discussed:    Comments:

## 2014-05-20 NOTE — Discharge Summary (Addendum)
Physician Discharge Summary  Emma Bailey WJX:[id number] DOB: 1965/06/25 DOA: 05/17/2014  PCP: Alvina FilbertHUSSEY, MICHAEL B, MD  Admit date: 05/17/2014 Discharge date: 05/20/2014  Time spent: 45 minutes  Recommendations for Outpatient Follow-up:  1. PCP in 1 week, CMet in 1 week 2. Please FU B12 level  Discharge Diagnoses:  Principal Problem:   Abdominal pain Active Problems:   Tylenol overdose   Hypertension   Migraine   Asthma   CKD (chronic kidney disease), stage II   Hypokalemia   Overdose on Tylenol   Discharge Condition: stable  Diet recommendation: Low sodium  Filed Weights   05/18/14 0400 05/18/14 2115 05/19/14 2117  Weight: 95.8 kg (211 lb 3.2 oz) 96.8 kg (213 lb 6.5 oz) 96.616 kg (213 lb)    History of present illness:  Chief Complaint: Nausea, vomiting, abdominal pain, headache HPI: Emma Bailey is a 49 y.o. female with past medical history of hypertension, GERD, migraine headache, chronic kidney disease-stage 2-3, asthma, diet controlled diabetes mellitus, presented to the ER with left upper quadrant abdominal pain, nausea and vomiting and HA  Hospital Course:  1. Tylenol overdose -this was to treat her HA and not a suicide attempt -s/p NAC protocol, LFTs trending down -counseled -needs cmet in 1 week  2. Abd pain/L flank pain -due to above and some muscular back pain -Ct Abd normal -treated with supportive care, diet slowly advanced and tolerated well -UA normal too  3. Elevated LFTs -due to 1, improving -CT abd unremarkable -hepatitis panel negative -s/p cholecystectomy  4. Hypokalemia -replaced  5. Htn -continue Avapro  6. H/o chronic intermittent numbness of extremities -FU B12 level and further workup per PCP  7. borderline DM -Hba1c 6  Discharge Exam: Filed Vitals:   05/20/14 0943  BP: 122/77  Pulse: 58  Temp: 98.7 F (37.1 C)  Resp: 18    General: AAOx3 Cardiovascular: S1S2/RRR Respiratory: CTAB  Discharge  Instructions   Discharge Instructions    Diet - low sodium heart healthy    Complete by:  As directed      Increase activity slowly    Complete by:  As directed           Current Discharge Medication List    CONTINUE these medications which have CHANGED   Details  pantoprazole (PROTONIX) 40 MG tablet Take 1 tablet (40 mg total) by mouth daily. Qty: 30 tablet, Refills: 0      CONTINUE these medications which have NOT CHANGED   Details  acetaminophen (TYLENOL) 500 MG tablet Take 4,000 mg by mouth every 6 (six) hours as needed for mild pain or moderate pain.    candesartan (ATACAND) 16 MG tablet Take 16 mg by mouth daily.    cholecalciferol (VITAMIN D) 1000 UNITS tablet Take 1,000 Units by mouth daily.    furosemide (LASIX) 40 MG tablet Take 20 mg by mouth 2 (two) times daily.    traMADol (ULTRAM) 50 MG tablet Take 1 tablet (50 mg total) by mouth every 6 (six) hours as needed. Qty: 9 tablet, Refills: 0      STOP taking these medications     hydrochlorothiazide (HYDRODIURIL) 25 MG tablet      cyclobenzaprine (FLEXERIL) 5 MG tablet      tiZANidine (ZANAFLEX) 2 MG tablet        Allergies  Allergen Reactions  . Iodine Other (See Comments)    unknown  . Nsaids     Nephrotic syndrome    Follow-up Information    Follow  up with HUSSEY, Charlaine Dalton, MD.   Specialty:  Unknown Physician Specialty   Why:  or new PCP in 1 week, please follow up B12 level   Contact information:   [address] [place] [address] [phone]        The results of significant diagnostics from this hospitalization (including imaging, microbiology, ancillary and laboratory) are listed below for reference.    Significant Diagnostic Studies: Ct Abdomen Pelvis Wo Contrast  05/18/2014   CLINICAL DATA:  Left upper quadrant pain for 6 hr. Nausea and vomiting.  EXAM: CT ABDOMEN AND PELVIS WITHOUT CONTRAST  TECHNIQUE: Multidetector CT imaging of the abdomen and pelvis was performed following  the standard protocol without IV contrast.  COMPARISON:  08/05/2012  FINDINGS: Minimal scarring and atelectasis are noted in the lung bases. There is no pleural effusion. The gallbladder is surgically absent. Dilatation of the common bile duct with tapering distally does not appear significantly changed and is likely related to prior cholecystectomy. The liver, spleen, adrenal glands, kidneys, and pancreas have an unremarkable unenhanced appearance.  There is no evidence of bowel obstruction. Appendix is unremarkable. No bowel wall thickening or inflammation is identified. Bladder is unremarkable. Uterus and ovaries are identified.  There is mild atherosclerotic vascular calcification. Elongated left external iliac lymph node measuring 9 mm in short axis is unchanged. No free fluid is seen. Small region of heterogeneous sclerosis and lucency in the L2 vertebral body is unchanged, possibly an underlying hemangioma. No acute osseous abnormality is identified.  IMPRESSION: No acute abnormality identified in the abdomen or pelvis.   Electronically Signed   By: Sebastian Ache   On: 05/18/2014 05:47   Dg Abd Acute W/chest  05/18/2014   CLINICAL DATA:  Constant left upper quadrant abdominal pain beginning 4 hr ago. Nausea and vomiting.  EXAM: DG ABDOMEN ACUTE W/ 1V CHEST  COMPARISON:  Chest radiographs 02/27/2014. CT abdomen and pelvis 08/05/2012  FINDINGS: The cardiomediastinal silhouette is within normal limits. The lungs are well inflated and clear. There is no evidence of pleural effusion or pneumothorax. No acute osseous abnormality is identified.  There is no evidence of intraperitoneal free air. Gas is present in nondilated small and large bowel without evidence of obstruction. A small amount of colonic stool is present. Right upper quadrant surgical clips are noted. Mild thoracolumbar levoscoliosis is noted.  IMPRESSION: Negative abdominal radiographs.  No acute cardiopulmonary disease.   Electronically Signed    By: Sebastian Ache   On: 05/18/2014 00:20    Microbiology: No results found for this or any previous visit (from the past 240 hour(s)).   Labs: Basic Metabolic Panel:  Recent Labs Lab 05/17/14 2350 05/18/14 0550 05/19/14 0835 05/20/14 0652  NA 137 140 140 140  K 3.1* 3.3* 2.9* 3.6  CL 102 105 108 109  CO2 GLUCOSE 132* 132* 112* 87  BUN 12 9 <5* <5*  CREATININE 1.11* 1.08 1.04 0.96  CALCIUM 9.3 8.2* 8.1* 8.2*   Liver Function Tests:  Recent Labs Lab 05/17/14 2350 05/18/14 0550 05/19/14 0835 05/20/14 0652  AST 1154* 1224* 391* 207*  ALT 511* 639* 497* 344*  ALKPHOS 315* 280* 327* 301*  BILITOT 2.3* 2.8* 3.6* 4.6*  PROT 8.8* 6.7 6.3 6.0  ALBUMIN 4.5 3.2* 2.9* 2.9*    Recent Labs Lab 05/17/14 2350  LIPASE 29   No results for input(s): AMMONIA in the last 168 hours. CBC:  Recent Labs Lab 05/17/14 2350  05/18/14 0550 05/20/14 0652  WBC 9.0 7.0 5.8  NEUTROABS 6.9  --   --   HGB 11.4* 10.6* 9.9*  HCT 33.6* 31.0* 29.4*  MCV 83.0 82.7 84.0  PLT 309 287 234   Cardiac Enzymes: No results for input(s): CKTOTAL, CKMB, CKMBINDEX, TROPONINI in the last 168 hours. BNP: BNP (last 3 results) No results for input(s): BNP in the last 8760 hours.  ProBNP (last 3 results) No results for input(s): PROBNP in the last 8760 hours.  CBG:  Recent Labs Lab 05/18/14 0753 05/19/14 0754 05/19/14 1202 05/19/14 1657 05/20/14 0816  GLUCAP 117* 110* 107* 184* 101*       Signed:  Nel Stoneking  [facility] 05/20/2014, 11:15 AM

## 2015-11-18 ENCOUNTER — Emergency Department (HOSPITAL_BASED_OUTPATIENT_CLINIC_OR_DEPARTMENT_OTHER)
Admission: EM | Admit: 2015-11-18 | Discharge: 2015-11-18 | Disposition: A | Discharge status: Home / Self Care | Payer: No Typology Code available for payment source | Attending: Emergency Medicine | Admitting: Emergency Medicine

## 2015-11-18 ENCOUNTER — Encounter (HOSPITAL_BASED_OUTPATIENT_CLINIC_OR_DEPARTMENT_OTHER): Payer: Self-pay | Admitting: *Deleted

## 2015-11-18 DIAGNOSIS — E1122 Type 2 diabetes mellitus with diabetic chronic kidney disease: Secondary | ICD-10-CM | POA: Insufficient documentation

## 2015-11-18 DIAGNOSIS — I129 Hypertensive chronic kidney disease with stage 1 through stage 4 chronic kidney disease, or unspecified chronic kidney disease: Secondary | ICD-10-CM | POA: Insufficient documentation

## 2015-11-18 DIAGNOSIS — J45909 Unspecified asthma, uncomplicated: Secondary | ICD-10-CM | POA: Insufficient documentation

## 2015-11-18 DIAGNOSIS — Z87891 Personal history of nicotine dependence: Secondary | ICD-10-CM | POA: Insufficient documentation

## 2015-11-18 DIAGNOSIS — R51 Headache: Secondary | ICD-10-CM | POA: Insufficient documentation

## 2015-11-18 DIAGNOSIS — R519 Headache, unspecified: Secondary | ICD-10-CM

## 2015-11-18 DIAGNOSIS — Z79899 Other long term (current) drug therapy: Secondary | ICD-10-CM | POA: Insufficient documentation

## 2015-11-18 DIAGNOSIS — E119 Type 2 diabetes mellitus without complications: Secondary | ICD-10-CM | POA: Insufficient documentation

## 2015-11-18 DIAGNOSIS — N182 Chronic kidney disease, stage 2 (mild): Secondary | ICD-10-CM | POA: Insufficient documentation

## 2015-11-18 DIAGNOSIS — Z791 Long term (current) use of non-steroidal anti-inflammatories (NSAID): Secondary | ICD-10-CM | POA: Insufficient documentation

## 2015-11-18 MED ORDER — METOCLOPRAMIDE HCL 5 MG/ML IJ SOLN
10.0000 mg | Freq: Once | INTRAMUSCULAR | Status: AC
  Administered 2015-11-18: 10 mg via INTRAVENOUS
  Filled 2015-11-18: qty 2

## 2015-11-18 MED ORDER — DIPHENHYDRAMINE HCL 50 MG/ML IJ SOLN
25.0000 mg | Freq: Once | INTRAMUSCULAR | Status: AC
  Administered 2015-11-18: 25 mg via INTRAVENOUS
  Filled 2015-11-18: qty 1

## 2015-11-18 MED ORDER — METOCLOPRAMIDE HCL 5 MG/ML IJ SOLN: 10.0000 mg | Freq: Once | INTRAMUSCULAR | Status: DC

## 2015-11-18 MED ORDER — SODIUM CHLORIDE 0.9 % IV BOLUS (SEPSIS)
1000.0000 mL | Freq: Once | INTRAVENOUS | Status: AC
  Administered 2015-11-18: 1000 mL via INTRAVENOUS

## 2015-11-18 MED ORDER — KETOROLAC TROMETHAMINE 30 MG/ML IJ SOLN
30.0000 mg | Freq: Once | INTRAMUSCULAR | Status: AC
  Administered 2015-11-18: 30 mg via INTRAVENOUS
  Filled 2015-11-18: qty 1

## 2015-11-18 MED ORDER — METOCLOPRAMIDE HCL 10 MG PO TABS: 10.0000 mg | ORAL_TABLET | Freq: Four times a day (QID) | ORAL | 0 refills | Status: AC

## 2015-11-18 MED ORDER — DEXAMETHASONE SODIUM PHOSPHATE 10 MG/ML IJ SOLN
10.0000 mg | Freq: Once | INTRAMUSCULAR | Status: AC
  Administered 2015-11-18: 10 mg via INTRAVENOUS
  Filled 2015-11-18: qty 1

## 2015-11-18 NOTE — ED Triage Notes (Signed)
Headache x 4 days. Light and noise sensitive. She took Toradol earlier that worked until [employer]9am.

## 2015-11-18 NOTE — ED Notes (Signed)
IV removed per RN Keli's order and site dry, clean, Catheter intact.

## 2015-11-18 NOTE — ED Provider Notes (Signed)
[facility]   CSN: [id number] Arrival date & time: 11/18/15  1302     History   Chief Complaint Chief Complaint  Patient presents with  . Migraine    HPI Emma Bailey is a 50 y.o. female.  HPI Emma Bailey is a 50 y.o. female with PMH significant for Hypertension, diabetes, asthma, and migraine who presents with gradual onset, constant, worsening headache she describes as her typical migraine headache. Headache is left temporal and parietal and behind her left eye. She denies any diplopia or visual changes. Associated symptoms include photophobia, sonophobia, and nausea. No vomiting, fever, neck pain/stiffness, numbness, weakness, slurred speech, facial droop. She currently sees a neurologist in [place]. She takes Topamax daily. She has taken Toradol and Imitrex with minimal relief.   Past Medical History:  Diagnosis Date  . Asthma   . Diabetes mellitus without complication (HCC)   . Hypertension   . Migraine   . Renal disorder     Patient Active Problem List   Diagnosis Date Noted  . Diabetes mellitus without complication (HCC)   . Tylenol overdose 05/18/2014  . CKD (chronic kidney disease), stage II 05/18/2014  . Abdominal pain 05/18/2014  . Hypokalemia 05/18/2014  . Hypertension   . Migraine   . Asthma   . Overdose on Tylenol   . Unintentional Tylenol overdose     Past Surgical History:  Procedure Laterality Date  . c sections    . CHOLECYSTECTOMY    . TUBAL LIGATION      OB History    No data available       Home Medications    Prior to Admission medications   Medication Sig Start Date End Date Taking? Authorizing Provider  Ketorolac Tromethamine (TORADOL ORAL PO) Take by mouth.   Yes Historical Provider, MD  SUMAtriptan (IMITREX) 100 MG tablet Take 100 mg by mouth every 2 (two) hours as needed for migraine. May repeat in 2 hours if headache persists or recurs.   Yes Historical Provider, MD  topiramate (TOPAMAX) 50 MG  tablet Take 50 mg by mouth 2 (two) times daily.   Yes Historical Provider, MD  valsartan-hydrochlorothiazide (DIOVAN-HCT) 160-25 MG tablet Take 1 tablet by mouth daily.   Yes Historical Provider, MD  acetaminophen (TYLENOL) 500 MG tablet Take 4,000 mg by mouth every 6 (six) hours as needed for mild pain or moderate pain.    Historical Provider, MD  candesartan (ATACAND) 16 MG tablet Take 16 mg by mouth daily.    Historical Provider, MD  cholecalciferol (VITAMIN D) 1000 UNITS tablet Take 1,000 Units by mouth daily.    Historical Provider, MD  famotidine (PEPCID) 20 MG tablet Take 1 tablet (20 mg total) by mouth daily. 05/20/14   Zannie Cove, MD  furosemide (LASIX) 40 MG tablet Take 20 mg by mouth 2 (two) times daily.    Historical Provider, MD  HYDROcodone-acetaminophen (VICODIN) 5-500 MG per tablet Take 1 tablet by mouth every 6 (six) hours as needed for pain. 05/20/14   Zannie Cove, MD  metoCLOPramide (REGLAN) 10 MG tablet Take 1 tablet (10 mg total) by mouth every 6 (six) hours. 11/18/15   Cheri Fowler, PA-C  pantoprazole (PROTONIX) 40 MG tablet Take 1 tablet (40 mg total) by mouth daily. 05/20/14   Zannie Cove, MD  traMADol (ULTRAM) 50 MG tablet Take 1 tablet (50 mg total) by mouth every 6 (six) hours as needed. Patient not taking: Reported on 05/18/2014 09/20/13   April Palumbo, MD  Family History No family history on file.  Social History Social History  Substance Use Topics  . Smoking status: Former Games developermoker  . Smokeless tobacco: Never Used  . Alcohol use No     Allergies   Iodine and Nsaids   Review of Systems Review of Systems All other systems negative unless otherwise stated in HPI   Physical Exam Updated Vital Signs BP 122/73   Pulse (!) 57   Temp 98.5 F (36.9 C) (Oral)   Resp 20   Ht 5\' 7"  (1.702 m)   Wt 96.6 kg   LMP 10/19/2015   SpO2 100%   BMI 33.36 kg/m   Physical Exam  Constitutional: She is oriented to person, place, and time. She appears  well-developed and well-nourished.  Non-toxic appearance. She does not have a sickly appearance. She does not appear ill.  HENT:  Head: Normocephalic and atraumatic.  Mouth/Throat: Oropharynx is clear and moist.  Eyes: Conjunctivae are normal. Pupils are equal, round, and reactive to light.  Neck: Normal range of motion. Neck supple.  No meningismus.  Cardiovascular: Normal rate and regular rhythm.   Pulmonary/Chest: Effort normal and breath sounds normal. No accessory muscle usage or stridor. No respiratory distress. She has no wheezes. She has no rhonchi. She has no rales.  Abdominal: Soft. Bowel sounds are normal. She exhibits no distension. There is no tenderness.  Musculoskeletal: Normal range of motion.  Lymphadenopathy:    She has no cervical adenopathy.  Neurological: She is alert and oriented to person, place, and time.  Mental Status:   AOx3.  Speech clear without dysarthria. Cranial Nerves:  I-not tested  II-PERRLA  III, IV, VI-EOMs intact  V-temporal and masseter strength intact  VII-symmetrical facial movements intact, no facial droop  VIII-hearing grossly intact bilaterally  IX, X-gag intact  XI-strength of sternomastoid and trapezius muscles 5/5  XII-tongue midline Motor:   Good muscle bulk and tone  Strength 5/5 bilaterally in upper and lower extremities   Cerebellar--intact RAMs, finger to nose intact bilaterally.  Gait normal  No pronator drift Sensory:  Intact in upper and lower extremities   Skin: Skin is warm and dry.  Psychiatric: She has a normal mood and affect. Her behavior is normal.     ED Treatments / Results  Labs (all labs ordered are listed, but only abnormal results are displayed) Labs Reviewed - No data to display  EKG  EKG Interpretation None       Radiology No results found.  Procedures Procedures (including critical care time)  Medications Ordered in ED Medications  sodium chloride 0.9 % bolus 1,000 mL (1,000 mLs  Intravenous New Bag/Given 11/18/15 1418)  dexamethasone (DECADRON) injection 10 mg (10 mg Intravenous Given 11/18/15 1418)  diphenhydrAMINE (BENADRYL) injection 25 mg (25 mg Intravenous Given 11/18/15 1418)  ketorolac (TORADOL) 30 MG/ML injection 30 mg (30 mg Intravenous Given 11/18/15 1418)  metoCLOPramide (REGLAN) injection 10 mg (10 mg Intravenous Given 11/18/15 1452)     Initial Impression / Assessment and Plan / ED Course  I have reviewed the triage vital signs and the nursing notes.  Pertinent labs & imaging results that were available during my care of the patient were reviewed by me and considered in my medical decision making (see chart for details).  Clinical Course   Typical migraine headache for the pt. Non focal neuro exam. No recent head trauma. No fever. Doubt meningitis. Doubt intracranial bleed. Doubt normal pressure hydrocephalus. No indication for imaging. Will treat with migraine cocktail  and reevaluate.  Patient feels improved.  Home with Reglan.  Follow up with neurologist. Return precautions discussed. Stable for discharge.     Final Clinical Impressions(s) / ED Diagnoses   Final diagnoses:  Bad headache    New Prescriptions New Prescriptions   METOCLOPRAMIDE (REGLAN) 10 MG TABLET    Take 1 tablet (10 mg total) by mouth every 6 (six) hours.     Cheri Fowler, PA-C 11/18/15 1530    Gwyneth Sprout, MD 11/20/15 2125

## 2015-12-13 ENCOUNTER — Emergency Department (HOSPITAL_BASED_OUTPATIENT_CLINIC_OR_DEPARTMENT_OTHER): Payer: Self-pay

## 2015-12-13 ENCOUNTER — Emergency Department (HOSPITAL_BASED_OUTPATIENT_CLINIC_OR_DEPARTMENT_OTHER)
Admission: EM | Admit: 2015-12-13 | Discharge: 2015-12-13 | Disposition: A | Discharge status: Home / Self Care | Payer: Self-pay | Attending: Emergency Medicine | Admitting: Emergency Medicine

## 2015-12-13 ENCOUNTER — Encounter (HOSPITAL_BASED_OUTPATIENT_CLINIC_OR_DEPARTMENT_OTHER): Payer: Self-pay

## 2015-12-13 DIAGNOSIS — J45909 Unspecified asthma, uncomplicated: Secondary | ICD-10-CM | POA: Insufficient documentation

## 2015-12-13 DIAGNOSIS — E1122 Type 2 diabetes mellitus with diabetic chronic kidney disease: Secondary | ICD-10-CM | POA: Insufficient documentation

## 2015-12-13 DIAGNOSIS — N182 Chronic kidney disease, stage 2 (mild): Secondary | ICD-10-CM | POA: Insufficient documentation

## 2015-12-13 DIAGNOSIS — Z79899 Other long term (current) drug therapy: Secondary | ICD-10-CM | POA: Insufficient documentation

## 2015-12-13 DIAGNOSIS — Z87891 Personal history of nicotine dependence: Secondary | ICD-10-CM | POA: Insufficient documentation

## 2015-12-13 DIAGNOSIS — I129 Hypertensive chronic kidney disease with stage 1 through stage 4 chronic kidney disease, or unspecified chronic kidney disease: Secondary | ICD-10-CM | POA: Insufficient documentation

## 2015-12-13 DIAGNOSIS — M25512 Pain in left shoulder: Secondary | ICD-10-CM | POA: Insufficient documentation

## 2015-12-13 MED ORDER — DICLOFENAC SODIUM 1 % TD GEL: 2.0000 g | Freq: Four times a day (QID) | TRANSDERMAL | 0 refills | Status: AC

## 2015-12-13 MED ORDER — METHOCARBAMOL 500 MG PO TABS: 500.0000 mg | ORAL_TABLET | Freq: Two times a day (BID) | ORAL | 0 refills | Status: AC

## 2015-12-13 MED FILL — METHOCARBAMOL 500 MG TABLET: 500 | 7 days supply | Qty: 14 | Fill #0

## 2015-12-13 MED FILL — DICLOFENAC SODIUM 1% GEL: 1 | 13 days supply | Qty: 100 | Fill #0

## 2015-12-13 NOTE — Discharge Instructions (Signed)
Your x-ray shows no fracture or evidence of arthritis, but the muscles in your left shoulder are very tight, which may be causing your pain and stiffness. Alternate ice and heat, and try gentle range of motion exercises as tolerated. Please apply the Voltaren gel for relief of your pain, and take Robaxin (muscle relaxer) as prescribed. Robaxin can make you drowsy, so take before sleep, and do not drive after taking it. Also, follow up with orthopedics (Dr. Norton BlizzardShane Hudnall - contact information provided) for reevaluation if pain does not improve.

## 2015-12-13 NOTE — ED Triage Notes (Signed)
C/o pain to left shoulder and arm x 2 weeks-pain started while lifting arm up-denies injury-pain worse with movement-pt keeping arm close to body in sling like position for comfort-NAD-steady gait

## 2015-12-13 NOTE — ED Provider Notes (Signed)
[facility] Provider Note   CSN: [id number] Arrival date & time: 12/13/15  1113     History   Chief Complaint Chief Complaint  Patient presents with  . Shoulder Pain    HPI Emma Bailey is a 50 y.o. female.  Patient is 50 yo F with PMH of chronic kidney disease, diabetes, and hypertension, presenting with left shoulder pain and stiffness x 2 weeks. Pain has been constant with worsening stiffness. She's had limited ROM due to pain, and unable to elevate arm or make any overhead movements. She tried [facility] for relief, but it was minimally effective. She denies any trauma to shoulder, history of arthritis or gout, or redness, swelling, and warmth at shoulder. She has no other complaints.      Past Medical History:  Diagnosis Date  . Asthma   . Diabetes mellitus without complication (HCC)   . Hypertension   . Migraine   . Renal disorder     Patient Active Problem List   Diagnosis Date Noted  . Diabetes mellitus without complication (HCC)   . Tylenol overdose 05/18/2014  . CKD (chronic kidney disease), stage II 05/18/2014  . Abdominal pain 05/18/2014  . Hypokalemia 05/18/2014  . Hypertension   . Migraine   . Asthma   . Overdose on Tylenol   . Unintentional Tylenol overdose     Past Surgical History:  Procedure Laterality Date  . c sections    . CHOLECYSTECTOMY    . TUBAL LIGATION      OB History    No data available       Home Medications    Prior to Admission medications   Medication Sig Start Date End Date Taking? Authorizing Provider  acetaminophen (TYLENOL) 500 MG tablet Take 4,000 mg by mouth every 6 (six) hours as needed for mild pain or moderate pain.    Historical Provider, MD  candesartan (ATACAND) 16 MG tablet Take 16 mg by mouth daily.    Historical Provider, MD  cholecalciferol (VITAMIN D) 1000 UNITS tablet Take 1,000 Units by mouth daily.    Historical Provider, MD  famotidine (PEPCID) 20 MG tablet Take 1 tablet (20 mg total) by  mouth daily. 05/20/14   Zannie CovePreetha Joseph, MD  furosemide (LASIX) 40 MG tablet Take 20 mg by mouth 2 (two) times daily.    Historical Provider, MD  HYDROcodone-acetaminophen (VICODIN) 5-500 MG per tablet Take 1 tablet by mouth every 6 (six) hours as needed for pain. 05/20/14   Zannie CovePreetha Joseph, MD  Ketorolac Tromethamine (TORADOL ORAL PO) Take by mouth.    Historical Provider, MD  metoCLOPramide (REGLAN) 10 MG tablet Take 1 tablet (10 mg total) by mouth every 6 (six) hours. 11/18/15   Cheri FowlerKayla Rose, PA-C  pantoprazole (PROTONIX) 40 MG tablet Take 1 tablet (40 mg total) by mouth daily. 05/20/14   Zannie CovePreetha Joseph, MD  SUMAtriptan (IMITREX) 100 MG tablet Take 100 mg by mouth every 2 (two) hours as needed for migraine. May repeat in 2 hours if headache persists or recurs.    Historical Provider, MD  topiramate (TOPAMAX) 50 MG tablet Take 50 mg by mouth 2 (two) times daily.    Historical Provider, MD  traMADol (ULTRAM) 50 MG tablet Take 1 tablet (50 mg total) by mouth every 6 (six) hours as needed. Patient not taking: Reported on 05/18/2014 09/20/13   April Palumbo, MD  valsartan-hydrochlorothiazide (DIOVAN-HCT) 160-25 MG tablet Take 1 tablet by mouth daily.    Historical Provider, MD  Family History No family history on file.  Social History Social History  Substance Use Topics  . Smoking status: Former Games developer  . Smokeless tobacco: Never Used  . Alcohol use No     Allergies   Iodine and Nsaids   Review of Systems Review of Systems  Constitutional: Negative for chills and fever.  Musculoskeletal: Positive for arthralgias (left shoulder pain). Negative for joint swelling.  Skin: Negative for color change and rash.  Neurological: Negative for weakness and numbness.     Physical Exam Updated Vital Signs BP 128/73 (BP Location: Right Arm)   Pulse (!) 54 Comment: pt asymptomatic; states that her heart rate is always low  Temp 97.9 F (36.6 C) (Oral)   Resp 18   Ht 5\' 7"  (1.702 m)   Wt 98 kg    LMP 10/19/2015   SpO2 100%   BMI 33.83 kg/m   Physical Exam  Constitutional: She appears well-developed and well-nourished. No distress.  HENT:  Head: Normocephalic and atraumatic.  Mouth/Throat: Oropharynx is clear and moist.  Eyes: Conjunctivae are normal.  Neck: Normal range of motion.  Cardiovascular: Normal rate.   Pulmonary/Chest: Effort normal. No respiratory distress.  Musculoskeletal: She exhibits no edema, tenderness or deformity.  Left shoulder with limited ROM due to pain. No bony TTP. Moderate muscular TTP without spasms. No swelling/effusion, bruising, erythema, or warmth. No crepitus/deformity. Strength and sensation grossly intact in all extremities, distal pulses intact.  Neurological: She is alert.  Skin: Skin is warm and dry.  Psychiatric: She has a normal mood and affect.  Nursing note and vitals reviewed.    ED Treatments / Results  Labs (all labs ordered are listed, but only abnormal results are displayed) Labs Reviewed - No data to display  EKG  EKG Interpretation None       Radiology Dg Shoulder Left  Result Date: 12/13/2015 CLINICAL DATA:  Left shoulder pain for 2 weeks.  No known injury. EXAM: LEFT SHOULDER - 2+ VIEW COMPARISON:  02/27/2014 FINDINGS: There is no evidence of fracture or dislocation. There is no evidence of arthropathy or other focal bone abnormality. Soft tissues are unremarkable. IMPRESSION: Negative. Electronically Signed   By: Myles Rosenthal M.D.   On: 12/13/2015 12:16    Procedures Procedures (including critical care time)  Medications Ordered in ED Medications - No data to display   Initial Impression / Assessment and Plan / ED Course  I have reviewed the triage vital signs and the nursing notes.  Pertinent labs & imaging results that were available during my care of the patient were reviewed by me and considered in my medical decision making (see chart for details).  Clinical Course    Patient is 50 yo F presenting  with left shoulder pain and stiffness x 2 weeks. She has limited ROM due to pain and moderate muscular TTP, but no evidence of septic joint, bony deformity, or dislocation. X-ray negative for arthritis or acute abnormalities. Pain is likely musculoskeletal, and patient is stable for d/c home. Given history of chronic kidney disease, topical NSAID (Voltaren gel) provided, as well as Robaxin and referral to orthopedics for reevaluation of shoulder pain. Return precautions discussed as outlined in discharge summary.  Final Clinical Impressions(s) / ED Diagnoses   Final diagnoses:  Left shoulder pain, unspecified chronicity    New Prescriptions Discharge Medication List as of 12/13/2015  1:03 PM    START taking these medications   Details  diclofenac sodium (VOLTAREN) 1 % GEL Apply 2  g topically 4 (four) times daily., Starting Tue 12/13/2015, Print    methocarbamol (ROBAXIN) 500 MG tablet Take 1 tablet (500 mg total) by mouth 2 (two) times daily., Starting Tue 12/13/2015, Print         Aristeo Hankerson F de NorthfieldVillier II, GeorgiaPA 12/13/15 1351    Geoffery Lyonsouglas Delo, MD 12/16/15 [phone]

## 2016-01-18 IMAGING — CT CT ABD-PELV W/O CM
2 of 4 series · 17 of 46 positions shown, 19 images · non-contrast
Comparison: 08/05/2012

CLINICAL DATA: Left upper quadrant pain for 6 hr. Nausea and
vomiting.

EXAM:
CT ABDOMEN AND PELVIS WITHOUT CONTRAST
TECHNIQUE: Multidetector CT imaging of the abdomen and pelvis was performed
following the standard protocol without IV contrast.

[Series 2: abd/pelvis 5.0 b31f · axial · 0.86mm/px · z∈[-65,+365]mm · 14 of 94 slices shown, 16 images]
[im 4/94  soft-tissue]
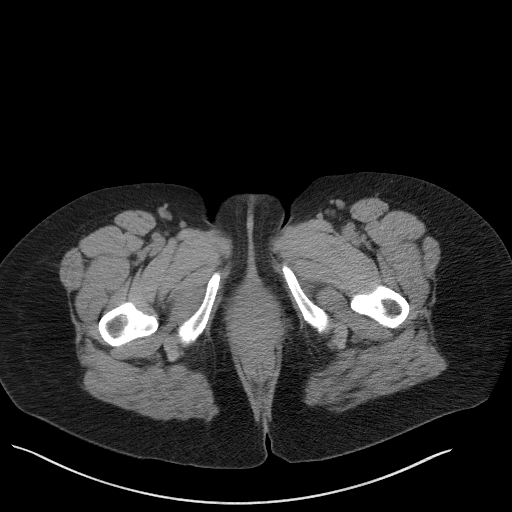
[im 4/94  bone]
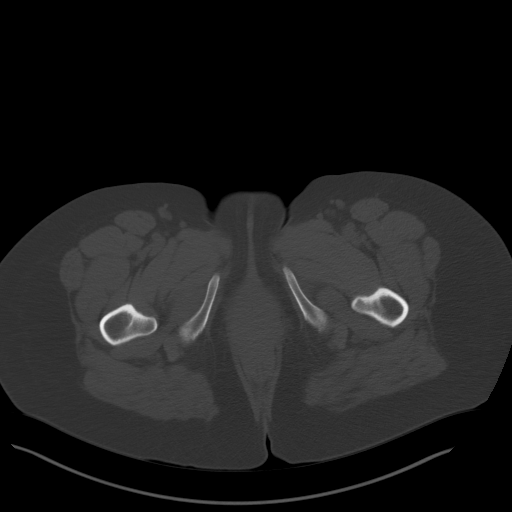
[im 12/94  soft-tissue]
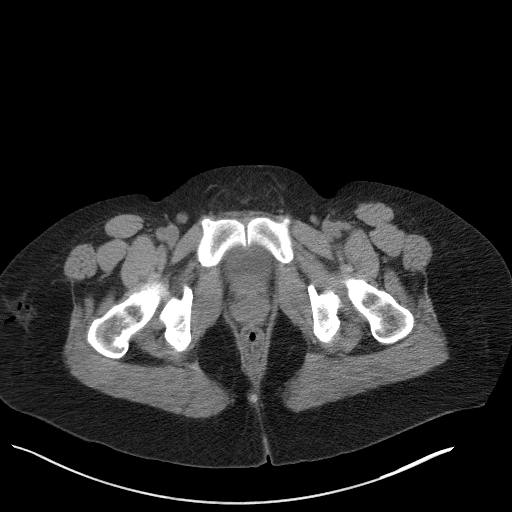
[im 20/94  soft-tissue]
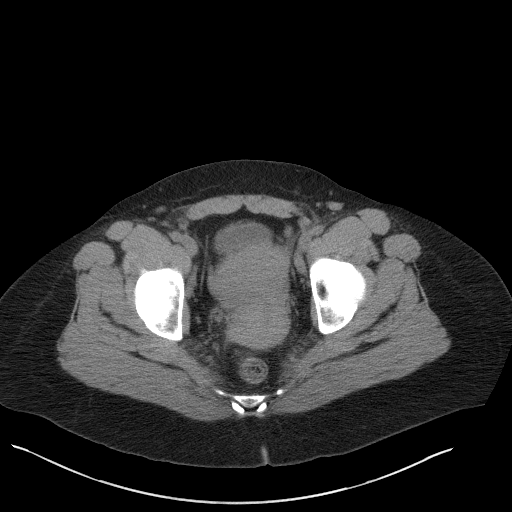
[im 24/94  soft-tissue]
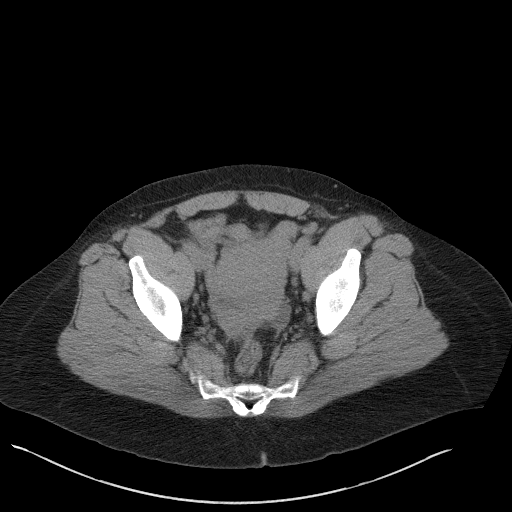
[im 32/94  soft-tissue]
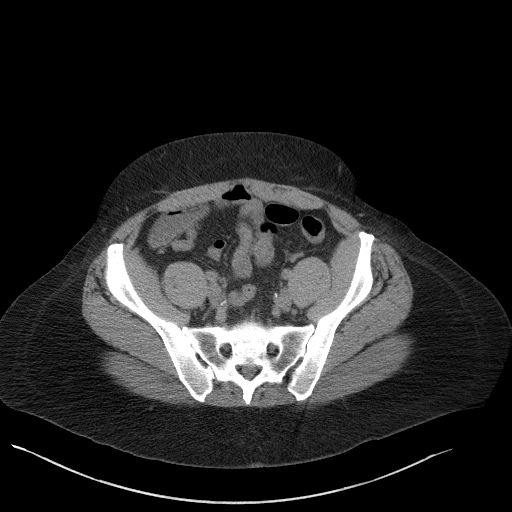
[im 39/94  soft-tissue]
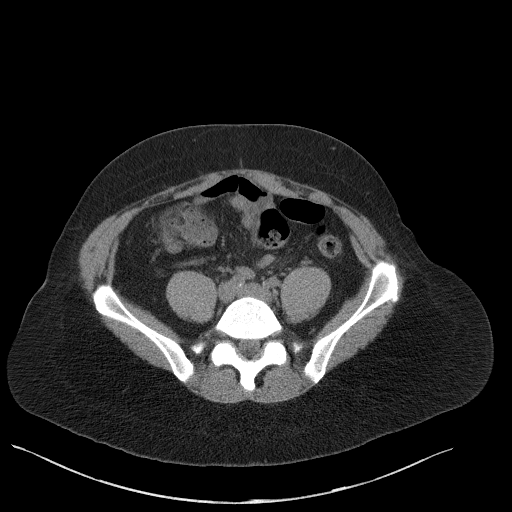
[im 43/94  soft-tissue]
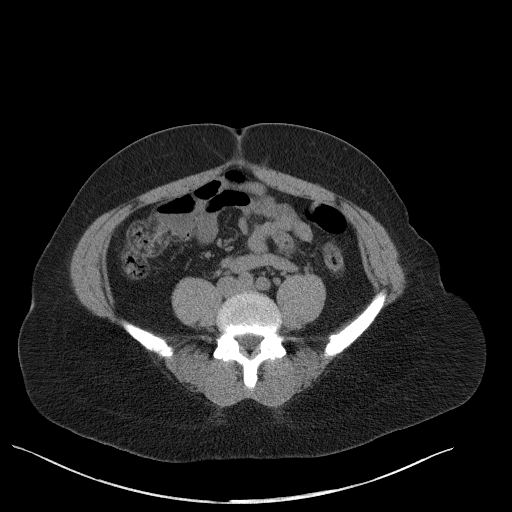
[im 51/94  soft-tissue]
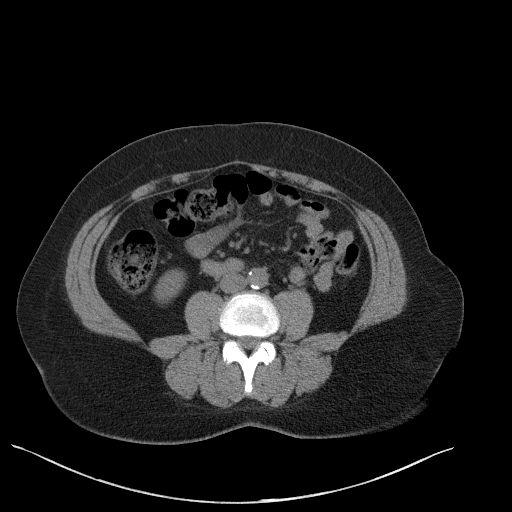
[im 55/94  soft-tissue]
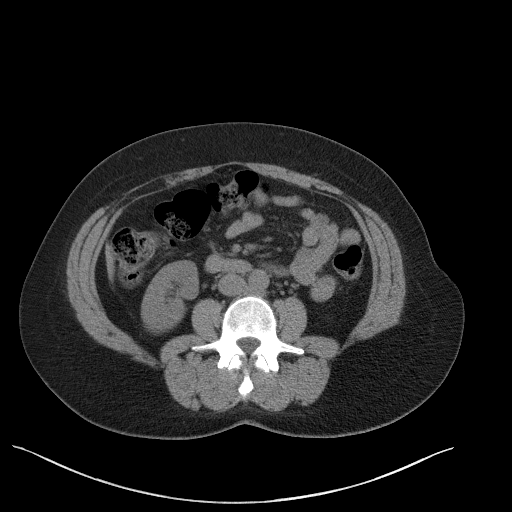
[im 55/94  bone]
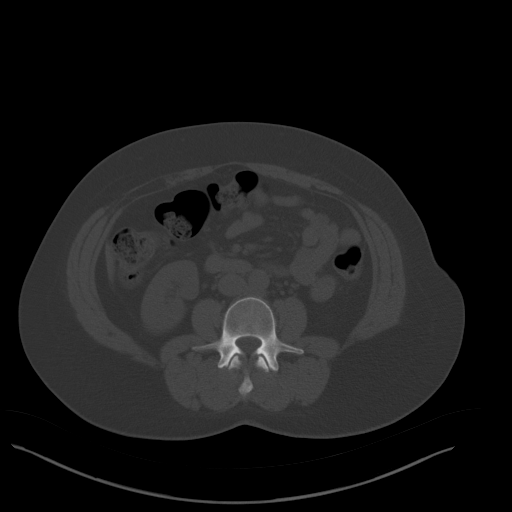
[im 63/94  soft-tissue]
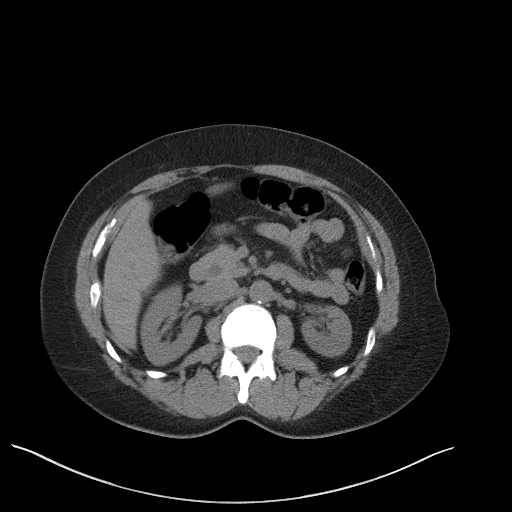
[im 70/94  soft-tissue]
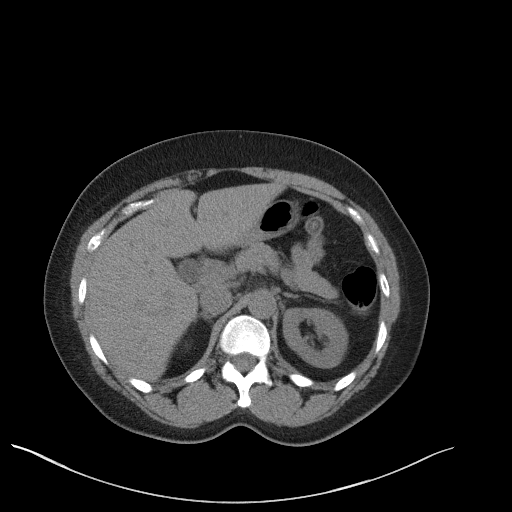
[im 74/94  soft-tissue]
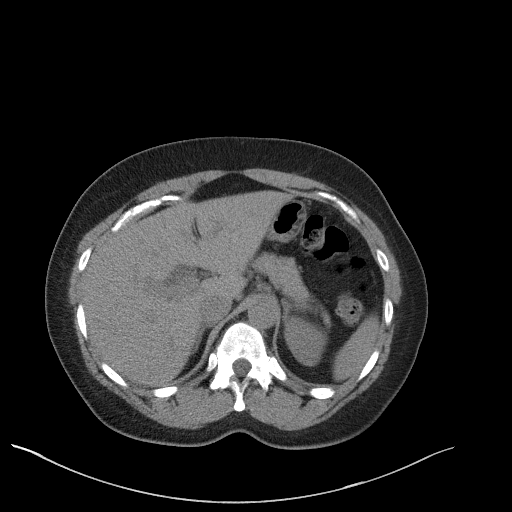
[im 82/94  soft-tissue]
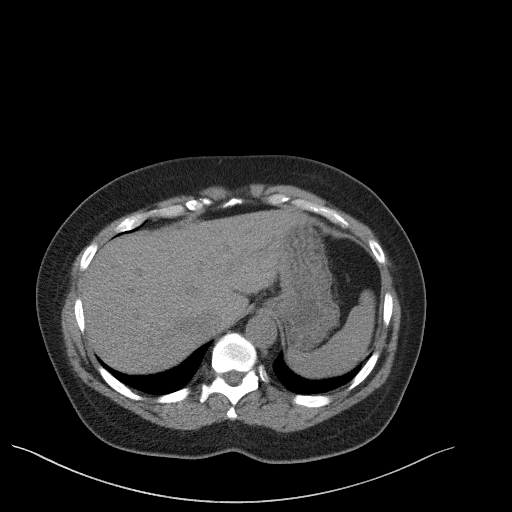
[im 90/94  soft-tissue]
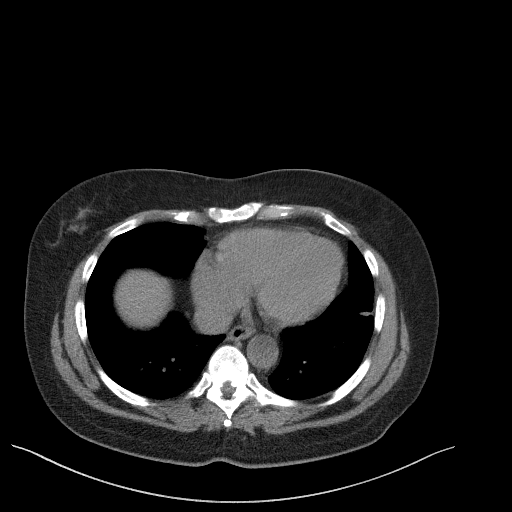

[Series 5: abd/pelvis 3.0 coronal · coronal · 0.81mm/px · 3 of 89 slices shown]
[im 30/89  soft-tissue]
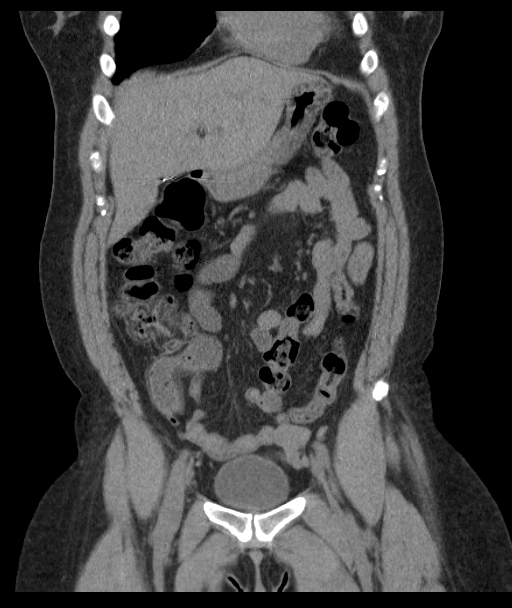
[im 40/89  soft-tissue]
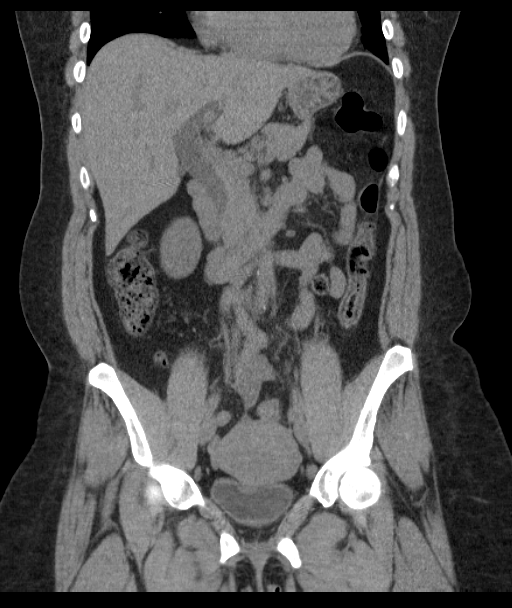
[im 49/89  soft-tissue]
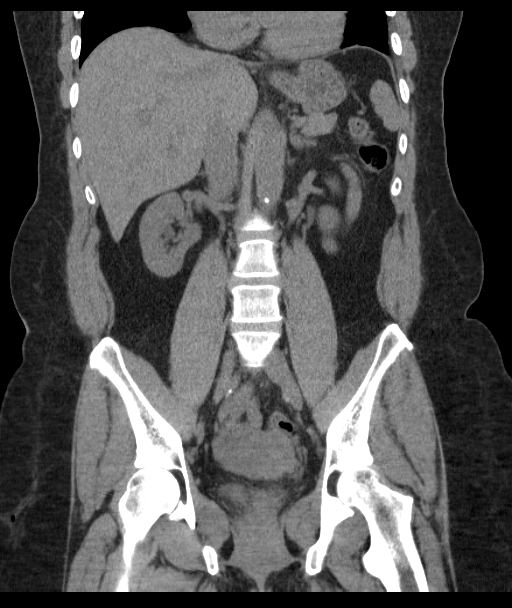

[17 of 46 positions shown; findings below may reference images not displayed]

FINDINGS: Minimal scarring and atelectasis are noted in the lung bases. There
is no pleural effusion. The gallbladder is surgically absent.
Dilatation of the common bile duct with tapering distally does not
appear significantly changed and is likely related to prior
cholecystectomy. The liver, spleen, adrenal glands, kidneys, and
pancreas have an unremarkable unenhanced appearance.

There is no evidence of bowel obstruction. Appendix is unremarkable.
No bowel wall thickening or inflammation is identified. Bladder is
unremarkable. Uterus and ovaries are identified.

There is mild atherosclerotic vascular calcification. Elongated left
external iliac lymph node measuring 9 mm in short axis is unchanged.
No free fluid is seen. Small region of heterogeneous sclerosis and
lucency in the L2 vertebral body is unchanged, possibly an
underlying hemangioma. No acute osseous abnormality is identified.
IMPRESSION: No acute abnormality identified in the abdomen or pelvis.

## 2016-10-03 ENCOUNTER — Encounter (HOSPITAL_BASED_OUTPATIENT_CLINIC_OR_DEPARTMENT_OTHER): Payer: Self-pay | Admitting: Emergency Medicine

## 2016-10-03 ENCOUNTER — Emergency Department (HOSPITAL_BASED_OUTPATIENT_CLINIC_OR_DEPARTMENT_OTHER)
Admission: EM | Admit: 2016-10-03 | Discharge: 2016-10-03 | Disposition: A | Discharge status: Home / Self Care | Payer: Self-pay | Attending: Emergency Medicine | Admitting: Emergency Medicine

## 2016-10-03 DIAGNOSIS — Z5321 Procedure and treatment not carried out due to patient leaving prior to being seen by health care provider: Secondary | ICD-10-CM | POA: Insufficient documentation

## 2016-10-03 DIAGNOSIS — M25551 Pain in right hip: Secondary | ICD-10-CM | POA: Insufficient documentation

## 2016-10-03 NOTE — ED Notes (Signed)
Pt not found in room or lobby

## 2016-10-03 NOTE — ED Triage Notes (Signed)
Patient states that she has had pain to her right hip x 1 month. The patient reports that it is getting worse and now the pain goes down her right leg

## 2018-06-09 ENCOUNTER — Encounter (HOSPITAL_BASED_OUTPATIENT_CLINIC_OR_DEPARTMENT_OTHER): Payer: Self-pay | Admitting: *Deleted

## 2018-06-09 ENCOUNTER — Emergency Department (HOSPITAL_BASED_OUTPATIENT_CLINIC_OR_DEPARTMENT_OTHER)
Admission: EM | Admit: 2018-06-09 | Discharge: 2018-06-09 | Disposition: A | Discharge status: Home / Self Care | Payer: Self-pay | Attending: Emergency Medicine | Admitting: Emergency Medicine

## 2018-06-09 ENCOUNTER — Other Ambulatory Visit: Payer: Self-pay

## 2018-06-09 DIAGNOSIS — N182 Chronic kidney disease, stage 2 (mild): Secondary | ICD-10-CM | POA: Insufficient documentation

## 2018-06-09 DIAGNOSIS — E1122 Type 2 diabetes mellitus with diabetic chronic kidney disease: Secondary | ICD-10-CM | POA: Insufficient documentation

## 2018-06-09 DIAGNOSIS — Z87891 Personal history of nicotine dependence: Secondary | ICD-10-CM | POA: Insufficient documentation

## 2018-06-09 DIAGNOSIS — Z79899 Other long term (current) drug therapy: Secondary | ICD-10-CM | POA: Insufficient documentation

## 2018-06-09 DIAGNOSIS — M5412 Radiculopathy, cervical region: Secondary | ICD-10-CM | POA: Insufficient documentation

## 2018-06-09 DIAGNOSIS — J45909 Unspecified asthma, uncomplicated: Secondary | ICD-10-CM | POA: Insufficient documentation

## 2018-06-09 DIAGNOSIS — I129 Hypertensive chronic kidney disease with stage 1 through stage 4 chronic kidney disease, or unspecified chronic kidney disease: Secondary | ICD-10-CM | POA: Insufficient documentation

## 2018-06-09 HISTORY — DX: Systemic lupus erythematosus, unspecified: M32.9

## 2018-06-09 HISTORY — DX: Carpal tunnel syndrome, unspecified upper limb: G56.00

## 2018-06-09 HISTORY — DX: Raynaud's syndrome without gangrene: I73.00

## 2018-06-09 HISTORY — DX: Reserved for concepts with insufficient information to code with codable children: IMO0002

## 2018-06-09 MED ORDER — CYCLOBENZAPRINE HCL 5 MG PO TABS: 5.0000 mg | ORAL_TABLET | Freq: Two times a day (BID) | ORAL | 0 refills | Status: AC | PRN

## 2018-06-09 MED ORDER — METHYLPREDNISOLONE 4 MG PO TBPK: ORAL_TABLET | ORAL | 0 refills | Status: AC

## 2018-06-09 MED ORDER — CYCLOBENZAPRINE HCL 5 MG PO TABS
5.0000 mg | ORAL_TABLET | Freq: Once | ORAL | Status: AC
  Administered 2018-06-09: 5 mg via ORAL
  Filled 2018-06-09: qty 1

## 2018-06-09 NOTE — ED Provider Notes (Signed)
[facility] EMERGENCY DEPARTMENT Provider Note   CSN: [id number] Arrival date & time: 06/09/18  0602    History   Chief Complaint Chief Complaint  Patient presents with  . right neck pain    HPI Emma Bailey is a 53 y.o. female.     HPI  This is a 53 year old female who presents with neck pain.  Patient reports 1 to 6429-month history of right-sided neck pain that radiates into her right hand.  She denies any known injury.  She has taken tramadol and Tylenol codeine with minimal relief.  She reports at times that she has had numbness and tingling in the right hand.  Pain is worse with range of motion the shoulder and neck.  She has not seen a physician.  Currently she rates her discomfort at 7 out of 10.  She denies any fevers, weakness, strokelike symptoms.  Past Medical History:  Diagnosis Date  . Asthma   . Carpal tunnel syndrome   . Hypertension   . Lupus (HCC)   . Migraine   . Raynauds disease   . Renal disorder     Patient Active Problem List   Diagnosis Date Noted  . Diabetes mellitus without complication (HCC)   . Tylenol overdose 05/18/2014  . CKD (chronic kidney disease), stage II 05/18/2014  . Abdominal pain 05/18/2014  . Hypokalemia 05/18/2014  . Hypertension   . Migraine   . Asthma   . Overdose on Tylenol   . Unintentional Tylenol overdose     Past Surgical History:  Procedure Laterality Date  . c sections    . CHOLECYSTECTOMY    . TUBAL LIGATION       OB History   No obstetric history on file.      Home Medications    Prior to Admission medications   Medication Sig Start Date End Date Taking? Authorizing Provider  acetaminophen-codeine (TYLENOL #3) 300-30 MG tablet Take by mouth every 4 (four) hours as needed for moderate pain.   Yes [provider]  hydroxychloroquine (PLAQUENIL) 200 MG tablet Take 200 mg by mouth daily.   Yes [provider]  metoprolol succinate (TOPROL-XL) 25 MG 24 hr tablet Take 25 mg by  mouth daily.   Yes [provider]  acetaminophen (TYLENOL) 500 MG tablet Take 4,000 mg by mouth every 6 (six) hours as needed for mild pain or moderate pain.    [provider]  candesartan (ATACAND) 16 MG tablet Take 16 mg by mouth daily.    [provider]  cholecalciferol (VITAMIN D) 1000 UNITS tablet Take 1,000 Units by mouth daily.    [provider]  cyclobenzaprine (FLEXERIL) 5 MG tablet Take 1 tablet (5 mg total) by mouth 2 (two) times daily as needed. 06/09/18   Arlon Bleier, Mayer Maskerourtney F, MD  diclofenac sodium (VOLTAREN) 1 % GEL Apply 2 g topically 4 (four) times daily. 12/13/15   de Villier, Daryl F II, PA  famotidine (PEPCID) 20 MG tablet Take 1 tablet (20 mg total) by mouth daily. 05/20/14   Zannie CoveJoseph, Preetha, MD  furosemide (LASIX) 40 MG tablet Take 20 mg by mouth 2 (two) times daily.    [provider]  HYDROcodone-acetaminophen (VICODIN) 5-500 MG per tablet Take 1 tablet by mouth every 6 (six) hours as needed for pain. 05/20/14   Zannie CoveJoseph, Preetha, MD  Ketorolac Tromethamine (TORADOL ORAL PO) Take by mouth.    [provider]  methocarbamol (ROBAXIN) 500 MG tablet Take 1 tablet (500 mg  total) by mouth 2 (two) times daily. 12/13/15   de Villier, Daryl F II, PA  methylPREDNISolone (MEDROL DOSEPAK) 4 MG TBPK tablet Take as directed on packet 06/09/18   Kennita Pavlovich, Mayer Masker, MD  metoCLOPramide (REGLAN) 10 MG tablet Take 1 tablet (10 mg total) by mouth every 6 (six) hours. 11/18/15   Cheri Fowler, PA-C  pantoprazole (PROTONIX) 40 MG tablet Take 1 tablet (40 mg total) by mouth daily. 05/20/14   Zannie Cove, MD  SUMAtriptan (IMITREX) 100 MG tablet Take 100 mg by mouth every 2 (two) hours as needed for migraine. May repeat in 2 hours if headache persists or recurs.    [provider]  topiramate (TOPAMAX) 50 MG tablet Take 50 mg by mouth 2 (two) times daily.    [provider]  traMADol (ULTRAM) 50 MG tablet Take 1 tablet (50 mg total)  by mouth every 6 (six) hours as needed. Patient not taking: Reported on 05/18/2014 09/20/13   Palumbo, April, MD  valsartan-hydrochlorothiazide (DIOVAN-HCT) 160-25 MG tablet Take 1 tablet by mouth daily.    [provider]    Family History No family history on file.  Social History Social History   Tobacco Use  . Smoking status: Former Games developer  . Smokeless tobacco: Never Used  Substance Use Topics  . Alcohol use: No  . Drug use: No     Allergies   Iodine and Nsaids   Review of Systems Review of Systems  Constitutional: Negative for fever.  Respiratory: Negative for shortness of breath.   Cardiovascular: Negative for chest pain.  Gastrointestinal: Negative for abdominal pain.  Musculoskeletal: Positive for neck pain.  Neurological: Positive for numbness. Negative for weakness.  All other systems reviewed and are negative.    Physical Exam Updated Vital Signs BP (!) 148/78 (BP Location: Left Arm)   Pulse 64   Temp 98 F (36.7 C) (Oral)   Resp 18   Ht 1.702 m (5\' 7" )   Wt 102.1 kg   LMP 10/19/2015   SpO2 100%   BMI 35.24 kg/m   Physical Exam Vitals signs and nursing note reviewed.  Constitutional:      Appearance: She is well-developed.     Comments: Overweight  HENT:     Head: Normocephalic and atraumatic.  Eyes:     Pupils: Pupils are equal, round, and reactive to light.  Neck:     Musculoskeletal: Normal range of motion and neck supple.     Comments: Tenderness palpation right upper trapezius, spasm noted Cardiovascular:     Rate and Rhythm: Normal rate and regular rhythm.  Pulmonary:     Effort: Pulmonary effort is normal. No respiratory distress.  Musculoskeletal: Normal range of motion.        General: Tenderness present. No swelling.     Comments: Tenderness over the right upper trapezius and right-sided neck muscles with spasm noted  Skin:    General: Skin is warm and dry.  Neurological:     Mental Status: She is alert and oriented  to person, place, and time.     Comments: 5 out of 5 deltoid, biceps, triceps strength bilaterally, 5 out of 5 grip strength bilaterally  Psychiatric:        Mood and Affect: Mood normal.      ED Treatments / Results  Labs (all labs ordered are listed, but only abnormal results are displayed) Labs Reviewed - No data to display  EKG None  Radiology No results found.  Procedures Procedures (  including critical care time)  Medications Ordered in ED Medications  cyclobenzaprine (FLEXERIL) tablet 5 mg (5 mg Oral Given 06/09/18 1610)     Initial Impression / Assessment and Plan / ED Course  I have reviewed the triage vital signs and the nursing notes.  Pertinent labs & imaging results that were available during my care of the patient were reviewed by me and considered in my medical decision making (see chart for details).        Patient presents with several month history of worsening neck pain into the right hand.  She is overall nontoxic-appearing and vital signs are reassuring.  She is neurologically intact.  She has intact strength.  Description of pain is highly suggestive of radicular pain.  She has tenderness and muscle spasm over the right upper trap.  She is intolerant to NSAIDs.  Would recommend muscle relaxant and Medrol Dosepak for anti-inflammation.  She is to follow-up closely with her primary physician if symptoms do not improve.  After history, exam, and medical workup I feel the patient has been appropriately medically screened and is safe for discharge home. Pertinent diagnoses were discussed with the patient. Patient was given return precautions.   Final Clinical Impressions(s) / ED Diagnoses   Final diagnoses:  Cervical radiculopathy    ED Discharge Orders         Ordered    methylPREDNISolone (MEDROL DOSEPAK) 4 MG TBPK tablet     06/09/18 0646    cyclobenzaprine (FLEXERIL) 5 MG tablet  2 times daily PRN     06/09/18 0646           Shon Baton, MD 06/09/18 [phone]

## 2018-06-09 NOTE — ED Triage Notes (Addendum)
C/o right posterior neck pain that started a couple months ago. Denies injury. States pain radiates into her right shoulder and down her right arm. States she has had some numbness/tingling to right arm.  Has taken tramadol without relief, heating pad, hot shower without relief. Has not been seen for symptoms by her MD.

## 2018-06-09 NOTE — Discharge Instructions (Signed)
You were seen today for neck pain and numbness and tingling into the arm.  This could be related to a pinched nerve in the muscles of your neck.  Take medications as prescribed.  Do not drive while taking Flexeril.  Follow-up with your primary physician for recheck if not improving.

## 2020-09-21 ENCOUNTER — Emergency Department (HOSPITAL_BASED_OUTPATIENT_CLINIC_OR_DEPARTMENT_OTHER)
Admission: EM | Admit: 2020-09-21 | Discharge: 2020-09-21 | Disposition: A | Discharge status: Home / Self Care | Payer: Self-pay | Attending: Emergency Medicine | Admitting: Emergency Medicine

## 2020-09-21 ENCOUNTER — Other Ambulatory Visit: Payer: Self-pay

## 2020-09-21 ENCOUNTER — Encounter (HOSPITAL_BASED_OUTPATIENT_CLINIC_OR_DEPARTMENT_OTHER): Payer: Self-pay

## 2020-09-21 ENCOUNTER — Emergency Department (HOSPITAL_BASED_OUTPATIENT_CLINIC_OR_DEPARTMENT_OTHER): Payer: Self-pay

## 2020-09-21 DIAGNOSIS — E876 Hypokalemia: Secondary | ICD-10-CM

## 2020-09-21 DIAGNOSIS — Z79899 Other long term (current) drug therapy: Secondary | ICD-10-CM | POA: Insufficient documentation

## 2020-09-21 DIAGNOSIS — N182 Chronic kidney disease, stage 2 (mild): Secondary | ICD-10-CM | POA: Insufficient documentation

## 2020-09-21 DIAGNOSIS — I129 Hypertensive chronic kidney disease with stage 1 through stage 4 chronic kidney disease, or unspecified chronic kidney disease: Secondary | ICD-10-CM | POA: Insufficient documentation

## 2020-09-21 DIAGNOSIS — E1122 Type 2 diabetes mellitus with diabetic chronic kidney disease: Secondary | ICD-10-CM | POA: Insufficient documentation

## 2020-09-21 DIAGNOSIS — R072 Precordial pain: Secondary | ICD-10-CM | POA: Insufficient documentation

## 2020-09-21 DIAGNOSIS — J45909 Unspecified asthma, uncomplicated: Secondary | ICD-10-CM | POA: Insufficient documentation

## 2020-09-21 DIAGNOSIS — R079 Chest pain, unspecified: Secondary | ICD-10-CM

## 2020-09-21 DIAGNOSIS — Z87891 Personal history of nicotine dependence: Secondary | ICD-10-CM | POA: Insufficient documentation

## 2020-09-21 LAB — CBC WITH DIFFERENTIAL/PLATELET
Abs Immature Granulocytes: 0.01 10*3/uL (ref 0.00–0.07)
Basophils Absolute: 0 10*3/uL (ref 0.0–0.1)
Basophils Relative: 1 %
Eosinophils Absolute: 0.2 10*3/uL (ref 0.0–0.5)
Eosinophils Relative: 4 %
HCT: 35.6 % — ABNORMAL LOW (ref 36.0–46.0)
Hemoglobin: 11.8 g/dL — ABNORMAL LOW (ref 12.0–15.0)
Immature Granulocytes: 0 %
Lymphocytes Relative: 47 %
Lymphs Abs: 2.4 10*3/uL (ref 0.7–4.0)
MCH: 28.9 pg (ref 26.0–34.0)
MCHC: 33.1 g/dL (ref 30.0–36.0)
MCV: 87 fL (ref 80.0–100.0)
Monocytes Absolute: 0.4 10*3/uL (ref 0.1–1.0)
Monocytes Relative: 7 %
Neutro Abs: 2.1 10*3/uL (ref 1.7–7.7)
Neutrophils Relative %: 41 %
Platelets: 224 10*3/uL (ref 150–400)
RBC: 4.09 MIL/uL (ref 3.87–5.11)
RDW: 13.6 % (ref 11.5–15.5)
WBC: 5.1 10*3/uL (ref 4.0–10.5)
nRBC: 0 % (ref 0.0–0.2)

## 2020-09-21 LAB — BASIC METABOLIC PANEL
Anion gap: 9 (ref 5–15)
BUN: 17 mg/dL (ref 6–20)
CO2: 25 mmol/L (ref 22–32)
Calcium: 8.9 mg/dL (ref 8.9–10.3)
Chloride: 104 mmol/L (ref 98–111)
Creatinine, Ser: 1.62 mg/dL — ABNORMAL HIGH (ref 0.44–1.00)
GFR, Estimated: 37 mL/min — ABNORMAL LOW (ref >60)
Glucose, Bld: 115 mg/dL — ABNORMAL HIGH (ref 70–99)
Potassium: 2.9 mmol/L — ABNORMAL LOW (ref 3.5–5.1)
Sodium: 138 mmol/L (ref 135–145)

## 2020-09-21 LAB — TROPONIN I (HIGH SENSITIVITY): Troponin I (High Sensitivity): 6 ng/L (ref <18)

## 2020-09-21 MED ORDER — POTASSIUM CHLORIDE CRYS ER 20 MEQ PO TBCR
20.0000 meq | EXTENDED_RELEASE_TABLET | Freq: Once | ORAL | Status: AC
  Administered 2020-09-21: 20 meq via ORAL
  Filled 2020-09-21: qty 1

## 2020-09-21 MED ORDER — POTASSIUM CHLORIDE 10 MEQ/100ML IV SOLN
10.0000 meq | Freq: Once | INTRAVENOUS | Status: AC
  Administered 2020-09-21: 10 meq via INTRAVENOUS
  Filled 2020-09-21: qty 100

## 2020-09-21 NOTE — ED Triage Notes (Signed)
Pt c/o central CP x today-denies fever/flu sx-NAD-steady gait

## 2020-09-21 NOTE — Discharge Instructions (Addendum)
Your potassium was low today which may be the cause of the chest pain you are experiencing.  This is most likely from taking the Lasix.  Also your kidney function was a little bit more elevated than normal at 1.62 today.  It will be important for you to see your doctor and have the potassium and creatinine levels rechecked in approximately 1 week.  Try using a compression sock and elevating your leg when you can see you not have to use Lasix as often.  If you start having worsening pain, shortness of breath, passing out or other concerns please return to the emergency room.

## 2020-09-21 NOTE — ED Notes (Signed)
Pt states that the pain in her chest has improved tremendously.

## 2020-09-21 NOTE — ED Provider Notes (Signed)
[facility] EMERGENCY DEPARTMENT Provider Note   CSN: [id number] Arrival date & time: 09/21/20  1859     History Chief Complaint  Patient presents with   Chest Pain    Emma Bailey is a 55 y.o. female.  The history is provided by the patient.  Chest Pain Pain location:  Substernal area Pain quality: pressure and tightness   Pain radiates to:  Does not radiate Pain severity:  Moderate Onset quality:  Sudden Duration:  1 day Timing:  Intermittent Progression:  Unchanged Chronicity:  New Context comment:  Woke up this morning with symptoms Relieved by:  None tried Worsened by:  Nothing Ineffective treatments:  None tried Associated symptoms: no abdominal pain, no anorexia, no cough, no diaphoresis, no dizziness, no fever, no headache, no heartburn, no lower extremity edema, no nausea, no palpitations, no shortness of breath, no vomiting and no weakness   Risk factors: no prior DVT/PE and no smoking   Risk factors comment:  Hx of lupus and CKD, migraines and diabetes.  no family hx of PE/DVT or MI.  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     No family history on file.  Social History   Tobacco Use   Smoking status: Former   Smokeless tobacco: Never  Building services engineer  Use: Never used  Substance Use Topics   Alcohol use: No   Drug use: No    Home Medications Prior to Admission medications   Medication Sig Start Date End Date Taking? Authorizing Provider  acetaminophen (TYLENOL) 500 MG tablet Take 4,000 mg by mouth every 6 (six) hours as needed for mild pain or moderate pain.    [provider]  acetaminophen-codeine (TYLENOL #3) 300-30 MG tablet Take by mouth every 4 (four) hours as needed for moderate pain.    [provider]  candesartan (ATACAND) 16 MG tablet Take 16 mg by mouth daily.    [provider]  cholecalciferol (VITAMIN D) 1000 UNITS tablet Take 1,000 Units by mouth daily.    [provider]  cyclobenzaprine (FLEXERIL) 5 MG tablet Take 1 tablet (5 mg total) by mouth 2 (two) times daily as needed. 06/09/18   Horton, Mayer Masker, MD  diclofenac sodium (VOLTAREN) 1 % GEL Apply 2 g topically 4 (four) times daily. 12/13/15   de Villier, Daryl F II, PA  famotidine (PEPCID) 20 MG tablet Take 1 tablet (20 mg total) by mouth daily. 05/20/14   Zannie Cove, MD  furosemide (LASIX) 40 MG tablet Take 20 mg by mouth 2 (two) times daily.    [provider]  HYDROcodone-acetaminophen (VICODIN) 5-500 MG per tablet Take 1 tablet by mouth every 6 (six) hours as needed for pain. 05/20/14   Zannie Cove, MD  hydroxychloroquine (PLAQUENIL) 200 MG tablet Take 200 mg by mouth daily.    [provider]  Ketorolac Tromethamine (TORADOL ORAL PO) Take by mouth.    [provider]  methocarbamol (ROBAXIN) 500 MG tablet Take 1 tablet (500 mg total) by mouth 2 (two) times daily. 12/13/15   de Villier, Daryl F II, PA  methylPREDNISolone (MEDROL DOSEPAK) 4 MG TBPK tablet Take as directed on packet 06/09/18   Horton, Mayer Masker, MD  metoCLOPramide (REGLAN) 10 MG tablet Take 1 tablet (10 mg total) by mouth every 6 (six) hours. 11/18/15   Cheri Fowler, PA-C  metoprolol succinate (TOPROL-XL) 25 MG 24 hr tablet Take 25  mg by mouth daily.    [provider]  pantoprazole (PROTONIX) 40 MG tablet Take 1 tablet (40 mg total) by mouth daily. 05/20/14   Zannie Cove, MD  SUMAtriptan (IMITREX) 100 MG tablet Take 100 mg by mouth every 2 (two) hours as needed for migraine. May repeat in 2 hours if headache persists or recurs.    [provider]  topiramate (TOPAMAX) 50 MG tablet Take 50 mg by mouth 2 (two) times daily.    [provider]  traMADol (ULTRAM) 50 MG tablet Take 1 tablet (50 mg total) by mouth every 6 (six) hours as needed. Patient not taking: Reported on 05/18/2014 09/20/13   Palumbo, April, MD  valsartan-hydrochlorothiazide (DIOVAN-HCT) 160-25 MG tablet Take 1 tablet by mouth daily.    [provider]    Allergies    Iodine and Nsaids  Review of Systems   Review of Systems  Constitutional:  Negative for diaphoresis and fever.  Respiratory:  Negative for cough and shortness of breath.   Cardiovascular:  Positive for chest pain. Negative for palpitations.  Gastrointestinal:  Negative for abdominal pain, anorexia, heartburn, nausea and vomiting.  Neurological:  Negative for dizziness, weakness and headaches.  All other systems reviewed and are negative.  Physical Exam Updated Vital Signs BP (!) 170/69 (BP Location: Left Arm)   Pulse (!) 44   Temp 98.2 F (36.8 C) (Oral)   Resp 18   Ht 5\' 7"  (1.702 m)   Wt 97.5 kg   LMP 10/19/2015   SpO2 100%   BMI 33.67 kg/m   Physical Exam Vitals and nursing note reviewed.  Constitutional:      General: She is not in acute distress.    Appearance: She is well-developed.  HENT:     Head: Normocephalic and atraumatic.     Right Ear: Tympanic membrane normal.     Left Ear: Tympanic membrane normal.     Nose: Congestion present.     Mouth/Throat:     Mouth: Mucous membranes are moist.  Eyes:     Extraocular Movements: Extraocular movements intact.     Pupils: Pupils are equal, round, and reactive to light.   Cardiovascular:     Rate and Rhythm: Regular rhythm. Bradycardia present.     Pulses: Normal pulses.     Heart sounds: Normal heart sounds. No murmur heard.   No friction rub.  Pulmonary:     Effort: Pulmonary effort is normal.     Breath sounds: Normal breath sounds. No wheezing or rales.  Chest:     Chest wall: No tenderness.  Abdominal:     General: Bowel sounds are normal. There is no distension.  Palpations: Abdomen is soft.     Tenderness: There is no abdominal tenderness. There is no guarding or rebound.  Musculoskeletal:        General: No tenderness. Normal range of motion.     Cervical back: Normal range of motion and neck supple.     Right lower leg: No edema.     Left lower leg: No edema.     Comments: No edema  Skin:    General: Skin is warm and dry.     Findings: No rash.  Neurological:     Mental Status: She is alert and oriented to person, place, and time. Mental status is at baseline.     Cranial Nerves: No cranial nerve deficit.  Psychiatric:        Mood and Affect: Mood normal.        Behavior: Behavior normal.    ED Results / Procedures / Treatments   Labs (all labs ordered are listed, but only abnormal results are displayed) Labs Reviewed  CBC WITH DIFFERENTIAL/PLATELET - Abnormal; Notable for the following components:      Result Value   Hemoglobin 11.8 (*)    HCT 35.6 (*)    All other components within normal limits  BASIC METABOLIC PANEL - Abnormal; Notable for the following components:   Potassium 2.9 (*)    Glucose, Bld 115 (*)    Creatinine, Ser 1.62 (*)    GFR, Estimated 37 (*)    All other components within normal limits  TROPONIN I (HIGH SENSITIVITY)  TROPONIN I (HIGH SENSITIVITY)    EKG EKG Interpretation  Date/Time:  Wednesday September 21 2020 19:09:04 EDT Ventricular Rate:  44 PR Interval:  146 QRS Duration: 92 QT Interval:  482 QTC Calculation: 412 R Axis:   55 Text Interpretation: Marked sinus bradycardia No significant  change since last tracing Confirmed by Gwyneth Sprout ([id number]) on 09/21/2020 7:17:13 PM  Radiology No results found.  Procedures Procedures   Medications Ordered in ED Medications - No data to display  ED Course  I have reviewed the triage vital signs and the nursing notes.  Pertinent labs & imaging results that were available during my care of the patient were reviewed by me and considered in my medical decision making (see chart for details).    MDM Rules/Calculators/A&P                           Patient is a 55 year old female presenting today with nonspecific chest pain that has been intermittent all day long.  It last for 2 or 3 minutes and then goes away and returns fairly quickly.  She cannot quantify the amount of episode she has had today because its been very frequent.  Yesterday was a normal day without symptoms.  Denies ever having symptoms like this in the past.  She has had no recent wheezing, coughing, fever or upper respiratory symptoms.  2 weeks she did use Flonase because she was feeling congested but has not been needing that recently.  She has no GI symptoms and does not know if symptoms worsen after eating because she has not had anything to eat all day because she has been busy at work and nobody brought her lunch the way they were supposed to.  She has not had new unilateral leg pain or swelling.  She always has swelling in her right lower leg but it is better than normal.  No prior history of PE  or DVT.  Family history of congestive heart failure but no family history of MI.  Patient has never had an MI.  She does use inhalers as needed but has not used any in several weeks.  She denies any urinary issues and no recent constipation or diarrhea.  She has not had nausea vomiting, diaphoresis, dizziness or near syncope today. EKG today with sinus bradycardia which is chronic for patient as she is on metoprolol and reports she always has a low heart rate.  No abnormal  findings otherwise.  Heart score of 4.  10:09 PM Patient CBC with stable hemoglobin of 11, BMP today with hypokalemia with current potassium of 2.9 and creatinine of 1.62 from a creatinine of 1.18 in November 2021.  Troponin within normal limits and chest x-ray within normal limits.  When speaking with the patient she has Lasix that she takes as needed but she has been taking it regularly over the last week or 2 because of her lymphedema.  She also takes losartan.  Suspect that is most likely the cause for her increase in creatinine however she has not had any blood work done since November of last year this might be chronic changes from her lupus.  Patient had potassium replaced as concerned that this might be the cause of her chest discomfort.  Low suspicion for ACS or PE at this time.  Did discuss with patient after potassium replacement try to avoid using Lasix and using compression socks for her leg and elevating it when she can.  Also discussed the importance of following up with her PCP to have both the potassium and creatinine levels rechecked in a week's time.  Patient was comfortable with this plan.  MDM   Amount and/or Complexity of Data Reviewed Clinical lab tests: ordered and reviewed Tests in the radiology section of CPT: ordered and reviewed Tests in the medicine section of CPT: ordered and reviewed Independent visualization of images, tracings, or specimens: yes     Final Clinical Impression(s) / ED Diagnoses Final diagnoses:  Nonspecific chest pain  Hypokalemia    Rx / DC Orders ED Discharge Orders     None        Gwyneth SproutPlunkett, Terrin Meddaugh, MD 09/21/20 2212

## 2020-09-21 NOTE — ED Notes (Signed)
Pt walked to restroom with no difficulty

## 2021-03-02 ENCOUNTER — Ambulatory Visit: Payer: 59 | Admitting: Family Medicine

## 2021-03-02 ENCOUNTER — Ambulatory Visit: Payer: Self-pay

## 2021-03-02 ENCOUNTER — Encounter: Payer: Self-pay | Admitting: Family Medicine

## 2021-03-02 VITALS — BP 130/80 | Ht 67.0 in | Wt 215.0 lb

## 2021-03-02 DIAGNOSIS — M6740 Ganglion, unspecified site: Secondary | ICD-10-CM

## 2021-03-02 NOTE — Patient Instructions (Signed)
Nice to meet you Please use ice as needed  Please send me a message in MyChart with any questions or updates.  Please see me back in 4 weeks or as needed if better.   --Dr. Raeford Razor

## 2021-03-02 NOTE — Assessment & Plan Note (Signed)
Acutely occurring. Cyst appreciated on ultrasound.  - counseled on home exercise therapy and supportive care - aspiration today.

## 2021-03-02 NOTE — Progress Notes (Signed)
°  Dianca Owensby - 56 y.o. female MRN [id number]  Date of birth: 06-19-1965  SUBJECTIVE:  Including CC & ROS.  No chief complaint on file.   Donell Sliwinski is a 56 y.o. female that is presenting with left wrist pain.  The pain is been ongoing for a few weeks now.  Denies any injury inciting event.  She is having pain in the second third and fourth digit when she is in extension.  The pain is occurring over the dorsum of the wrist.  Has had swelling in the area that is intermittent.  Review of the note from 1/20 shows she was provided physical therapy for left shoulder injection and prednisone.   Review of Systems See HPI   HISTORY: Past Medical, Surgical, Social, and Family History Reviewed & Updated per EMR.   Pertinent Historical Findings include:  Past Medical History:  Diagnosis Date   Asthma    Carpal tunnel syndrome    Hypertension    Lupus (HCC)    Migraine    Raynauds disease    Renal disorder     Past Surgical History:  Procedure Laterality Date   c sections     CHOLECYSTECTOMY     TUBAL LIGATION       PHYSICAL EXAM:  VS: BP 130/80 (BP Location: Left Arm, Patient Position: Sitting)    Ht 5\' 7"  (1.702 m)    Wt 215 lb (97.5 kg)    LMP 10/19/2015    BMI 33.67 kg/m  Physical Exam Gen: NAD, alert, cooperative with exam, well-appearing MSK:  Neurovascularly intact    Limited ultrasound: Left wrist:  There is an effusion appreciated within the third and fourth dorsal compartment.  Summary: Ganglion cyst within the tendon sheath  Ultrasound and interpretation by 10/21/2015, MD   Aspiration/Injection Procedure Note Aleiyah Halpin 1965/05/16  Procedure: Aspiration Indications: Left wrist pain  Procedure Details Consent: Risks of procedure as well as the alternatives and risks of each were explained to the (patient/caregiver).  Consent for procedure obtained. Time Out: Verified patient identification, verified procedure, site/side was marked, verified correct  patient position, special equipment/implants available, medications/allergies/relevent history reviewed, required imaging and test results available.  Performed.  The area was cleaned with iodine and alcohol swabs.    The left wrist dorsal was injected using 3 cc's of 1% lidocaine with a 25 1 1/2" needle.  An 18-gauge 1-1/2 inch needle was used to achieve aspiration.  Ultrasound was used. Images were obtained in long views showing the injection.    Amount of Fluid Aspirated: minimal amount Character of Fluid: red colored Fluid was sent for: n/'a  A sterile dressing was applied.  Patient did tolerate procedure well.    ASSESSMENT & PLAN:   Ganglion cyst Acutely occurring. Cyst appreciated on ultrasound.  - counseled on home exercise therapy and supportive care - aspiration today.

## 2021-03-28 ENCOUNTER — Telehealth: Payer: Self-pay | Admitting: Family Medicine

## 2021-03-28 DIAGNOSIS — M6740 Ganglion, unspecified site: Secondary | ICD-10-CM

## 2021-03-28 NOTE — Telephone Encounter (Signed)
Patient called states 2/2 cyst removed but it has returned worsen,very painful & larger (request to be referred out to a hand specialist).  --Forwarding request to provider for review.  --glh

## 2021-04-21 ENCOUNTER — Other Ambulatory Visit: Payer: Self-pay

## 2022-05-14 ENCOUNTER — Encounter: Payer: Self-pay | Admitting: *Deleted

## 2022-05-24 IMAGING — DX DG CHEST 1V PORT
1 series · 1 of 1 positions shown · non-contrast
Comparison: 02/27/2014

CLINICAL DATA: Central chest pain today

EXAM:
PORTABLE CHEST 1 VIEW

[chest ap]
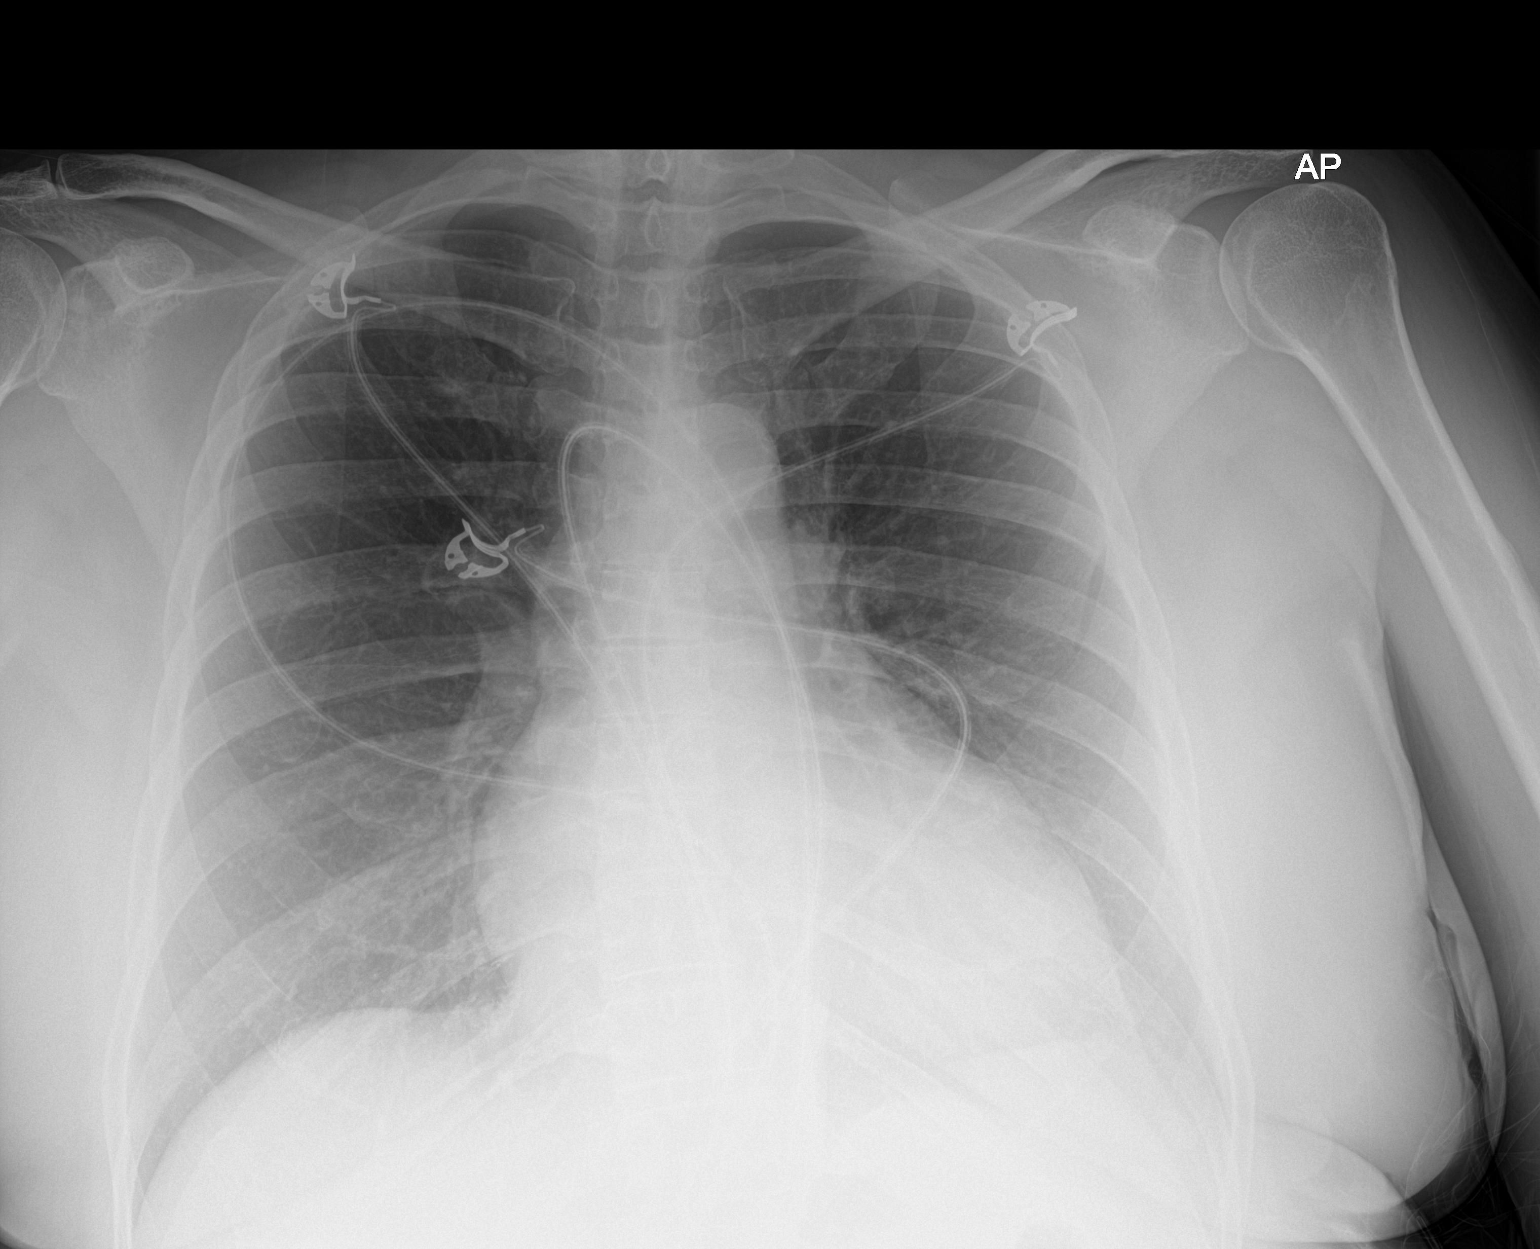

[1 of 1 positions shown; findings below may reference images not displayed]

FINDINGS: The heart size and mediastinal contours are within normal limits.
Both lungs are clear. The visualized skeletal structures are
unremarkable.
IMPRESSION: No active disease.

## 2023-11-27 DIAGNOSIS — I129 Hypertensive chronic kidney disease with stage 1 through stage 4 chronic kidney disease, or unspecified chronic kidney disease: Secondary | ICD-10-CM | POA: Diagnosis not present

## 2023-11-27 DIAGNOSIS — E876 Hypokalemia: Secondary | ICD-10-CM | POA: Diagnosis not present

## 2023-11-27 DIAGNOSIS — N189 Chronic kidney disease, unspecified: Secondary | ICD-10-CM | POA: Diagnosis not present

## 2023-11-27 DIAGNOSIS — Z87891 Personal history of nicotine dependence: Secondary | ICD-10-CM | POA: Diagnosis not present

## 2023-11-27 DIAGNOSIS — M79645 Pain in left finger(s): Secondary | ICD-10-CM | POA: Diagnosis not present

## 2023-12-11 DIAGNOSIS — I361 Nonrheumatic tricuspid (valve) insufficiency: Secondary | ICD-10-CM | POA: Diagnosis not present

## 2023-12-11 DIAGNOSIS — I517 Cardiomegaly: Secondary | ICD-10-CM | POA: Diagnosis not present

## 2023-12-12 DIAGNOSIS — L301 Dyshidrosis [pompholyx]: Secondary | ICD-10-CM | POA: Diagnosis not present

## 2023-12-12 DIAGNOSIS — G8929 Other chronic pain: Secondary | ICD-10-CM | POA: Diagnosis not present

## 2023-12-12 DIAGNOSIS — Z1231 Encounter for screening mammogram for malignant neoplasm of breast: Secondary | ICD-10-CM | POA: Diagnosis not present

## 2023-12-12 DIAGNOSIS — M329 Systemic lupus erythematosus, unspecified: Secondary | ICD-10-CM | POA: Diagnosis not present

## 2023-12-12 DIAGNOSIS — L819 Disorder of pigmentation, unspecified: Secondary | ICD-10-CM | POA: Diagnosis not present

## 2023-12-12 DIAGNOSIS — G43009 Migraine without aura, not intractable, without status migrainosus: Secondary | ICD-10-CM | POA: Diagnosis not present

## 2023-12-12 DIAGNOSIS — M79674 Pain in right toe(s): Secondary | ICD-10-CM | POA: Diagnosis not present

## 2023-12-12 DIAGNOSIS — M65942 Unspecified synovitis and tenosynovitis, left hand: Secondary | ICD-10-CM | POA: Diagnosis not present
